# Patient Record
Sex: Male | Born: 1945
Health system: Southern US, Community
[De-identification: ages and names within clinical notes are randomized; demographics above are authoritative.]

## PROBLEM LIST (undated history)

## (undated) DIAGNOSIS — C801 Malignant (primary) neoplasm, unspecified: Secondary | ICD-10-CM

## (undated) DIAGNOSIS — E785 Hyperlipidemia, unspecified: Secondary | ICD-10-CM

## (undated) HISTORY — PX: MELANOMA EXCISION: SHX5266

## (undated) HISTORY — PX: HERNIA REPAIR: SHX51

---

## 2004-02-27 ENCOUNTER — Ambulatory Visit: Payer: Self-pay | Admitting: Internal Medicine

## 2004-05-16 ENCOUNTER — Ambulatory Visit: Payer: Self-pay | Admitting: Radiation Oncology

## 2004-06-28 ENCOUNTER — Ambulatory Visit: Payer: Self-pay | Admitting: Internal Medicine

## 2004-07-05 ENCOUNTER — Ambulatory Visit: Payer: Self-pay | Admitting: Internal Medicine

## 2004-07-27 ENCOUNTER — Ambulatory Visit: Payer: Self-pay | Admitting: Internal Medicine

## 2004-09-14 ENCOUNTER — Ambulatory Visit: Payer: Self-pay | Admitting: Radiation Oncology

## 2004-09-16 ENCOUNTER — Ambulatory Visit: Payer: Self-pay | Admitting: Radiation Oncology

## 2004-09-30 ENCOUNTER — Ambulatory Visit: Payer: Self-pay | Admitting: General Surgery

## 2004-10-05 ENCOUNTER — Ambulatory Visit: Payer: Self-pay | Admitting: Internal Medicine

## 2004-10-27 ENCOUNTER — Ambulatory Visit: Payer: Self-pay | Admitting: Internal Medicine

## 2004-11-26 ENCOUNTER — Ambulatory Visit: Payer: Self-pay | Admitting: Internal Medicine

## 2004-12-12 ENCOUNTER — Ambulatory Visit: Payer: Self-pay | Admitting: Unknown Physician Specialty

## 2004-12-27 ENCOUNTER — Ambulatory Visit: Payer: Self-pay | Admitting: Internal Medicine

## 2005-02-08 ENCOUNTER — Ambulatory Visit: Payer: Self-pay | Admitting: Internal Medicine

## 2005-02-26 ENCOUNTER — Ambulatory Visit: Payer: Self-pay | Admitting: Internal Medicine

## 2005-03-29 ENCOUNTER — Ambulatory Visit: Payer: Self-pay | Admitting: Internal Medicine

## 2005-05-30 ENCOUNTER — Ambulatory Visit: Payer: Self-pay | Admitting: Internal Medicine

## 2005-06-29 ENCOUNTER — Ambulatory Visit: Payer: Self-pay | Admitting: Internal Medicine

## 2005-10-25 ENCOUNTER — Ambulatory Visit: Payer: Self-pay | Admitting: Internal Medicine

## 2005-10-27 ENCOUNTER — Ambulatory Visit: Payer: Self-pay | Admitting: Internal Medicine

## 2006-01-17 ENCOUNTER — Ambulatory Visit: Payer: Self-pay | Admitting: Internal Medicine

## 2006-01-27 ENCOUNTER — Ambulatory Visit: Payer: Self-pay | Admitting: Internal Medicine

## 2006-02-26 ENCOUNTER — Ambulatory Visit: Payer: Self-pay | Admitting: Internal Medicine

## 2006-04-27 ENCOUNTER — Ambulatory Visit: Payer: Self-pay | Admitting: Internal Medicine

## 2006-07-16 ENCOUNTER — Ambulatory Visit: Payer: Self-pay | Admitting: Internal Medicine

## 2006-07-28 ENCOUNTER — Ambulatory Visit: Payer: Self-pay | Admitting: Internal Medicine

## 2006-08-28 ENCOUNTER — Ambulatory Visit: Payer: Self-pay | Admitting: Internal Medicine

## 2007-02-27 ENCOUNTER — Ambulatory Visit: Payer: Self-pay | Admitting: Internal Medicine

## 2007-03-13 ENCOUNTER — Ambulatory Visit: Payer: Self-pay | Admitting: Internal Medicine

## 2007-03-30 ENCOUNTER — Ambulatory Visit: Payer: Self-pay | Admitting: Internal Medicine

## 2007-05-02 ENCOUNTER — Ambulatory Visit: Payer: Self-pay | Admitting: Radiation Oncology

## 2007-05-02 ENCOUNTER — Ambulatory Visit: Payer: Self-pay | Admitting: Internal Medicine

## 2007-05-30 ENCOUNTER — Ambulatory Visit: Payer: Self-pay | Admitting: Radiation Oncology

## 2008-02-27 ENCOUNTER — Ambulatory Visit: Payer: Self-pay | Admitting: Internal Medicine

## 2008-03-05 ENCOUNTER — Ambulatory Visit: Payer: Self-pay | Admitting: Internal Medicine

## 2008-03-18 ENCOUNTER — Ambulatory Visit: Payer: Self-pay | Admitting: Internal Medicine

## 2008-03-29 ENCOUNTER — Ambulatory Visit: Payer: Self-pay | Admitting: Internal Medicine

## 2008-04-28 ENCOUNTER — Ambulatory Visit: Payer: Self-pay | Admitting: Radiation Oncology

## 2008-04-30 ENCOUNTER — Ambulatory Visit: Payer: Self-pay | Admitting: Internal Medicine

## 2008-05-29 ENCOUNTER — Ambulatory Visit: Payer: Self-pay | Admitting: Radiation Oncology

## 2009-01-13 ENCOUNTER — Ambulatory Visit: Payer: Self-pay | Admitting: Family Medicine

## 2009-03-29 ENCOUNTER — Ambulatory Visit: Payer: Self-pay | Admitting: Internal Medicine

## 2009-04-28 ENCOUNTER — Ambulatory Visit: Payer: Self-pay | Admitting: Internal Medicine

## 2011-04-26 ENCOUNTER — Ambulatory Visit: Payer: Self-pay | Admitting: Surgery

## 2011-04-26 DIAGNOSIS — I499 Cardiac arrhythmia, unspecified: Secondary | ICD-10-CM

## 2011-05-03 ENCOUNTER — Ambulatory Visit: Payer: Self-pay | Admitting: Surgery

## 2012-12-06 LAB — HM HEPATITIS C SCREENING LAB: HM Hepatitis Screen: NEGATIVE

## 2014-05-08 DIAGNOSIS — I739 Peripheral vascular disease, unspecified: Secondary | ICD-10-CM | POA: Insufficient documentation

## 2014-07-29 LAB — TSH: TSH: 2.07 u[IU]/mL (ref 0.41–5.90)

## 2014-07-29 LAB — LIPID PANEL
Cholesterol: 244 mg/dL — AB (ref 0–200)
HDL: 80 mg/dL — AB (ref 35–70)
LDL Cholesterol: 143 mg/dL
LDl/HDL Ratio: 3.1
Triglycerides: 106 mg/dL (ref 40–160)

## 2014-07-29 LAB — CBC AND DIFFERENTIAL
HCT: 44 % (ref 41–53)
Hemoglobin: 14.6 g/dL (ref 13.5–17.5)
Neutrophils Absolute: 2 /uL
Platelets: 220 10*3/uL (ref 150–399)
WBC: 3.3 10*3/mL

## 2014-07-29 LAB — BASIC METABOLIC PANEL
BUN: 23 mg/dL — AB (ref 4–21)
CREATININE: 1 mg/dL (ref 0.6–1.3)
GLUCOSE: 103 mg/dL
POTASSIUM: 4.6 mmol/L (ref 3.4–5.3)
SODIUM: 141 mmol/L (ref 137–147)

## 2014-07-29 LAB — HEPATIC FUNCTION PANEL
ALK PHOS: 50 U/L (ref 25–125)
ALT: 21 U/L (ref 10–40)
AST: 24 U/L (ref 14–40)
BILIRUBIN, TOTAL: 0.4 mg/dL

## 2014-07-29 LAB — PSA: PSA: 2.1

## 2015-04-03 ENCOUNTER — Other Ambulatory Visit: Payer: Self-pay | Admitting: Family Medicine

## 2015-05-03 ENCOUNTER — Encounter
Admission: RE | Disposition: A | Discharge status: Home / Self Care | Payer: Self-pay | Source: Ambulatory Visit | Attending: Unknown Physician Specialty

## 2015-05-03 ENCOUNTER — Ambulatory Visit: Payer: BLUE CROSS/BLUE SHIELD | Admitting: Anesthesiology

## 2015-05-03 ENCOUNTER — Encounter: Payer: Self-pay | Admitting: *Deleted

## 2015-05-03 ENCOUNTER — Ambulatory Visit
Admission: RE | Admit: 2015-05-03 | Discharge: 2015-05-03 | Disposition: A | Discharge status: Home / Self Care | Payer: BLUE CROSS/BLUE SHIELD | Source: Ambulatory Visit | Attending: Unknown Physician Specialty | Admitting: Unknown Physician Specialty

## 2015-05-03 DIAGNOSIS — Z7982 Long term (current) use of aspirin: Secondary | ICD-10-CM | POA: Diagnosis not present

## 2015-05-03 DIAGNOSIS — K640 First degree hemorrhoids: Secondary | ICD-10-CM | POA: Insufficient documentation

## 2015-05-03 DIAGNOSIS — K5730 Diverticulosis of large intestine without perforation or abscess without bleeding: Secondary | ICD-10-CM | POA: Diagnosis not present

## 2015-05-03 DIAGNOSIS — Z86010 Personal history of colonic polyps: Secondary | ICD-10-CM | POA: Diagnosis present

## 2015-05-03 DIAGNOSIS — Z800 Family history of malignant neoplasm of digestive organs: Secondary | ICD-10-CM | POA: Diagnosis present

## 2015-05-03 DIAGNOSIS — Z79899 Other long term (current) drug therapy: Secondary | ICD-10-CM | POA: Diagnosis not present

## 2015-05-03 DIAGNOSIS — Z1211 Encounter for screening for malignant neoplasm of colon: Secondary | ICD-10-CM | POA: Diagnosis not present

## 2015-05-03 DIAGNOSIS — D122 Benign neoplasm of ascending colon: Secondary | ICD-10-CM | POA: Insufficient documentation

## 2015-05-03 HISTORY — PX: COLONOSCOPY: SHX5424

## 2015-05-03 HISTORY — DX: Malignant (primary) neoplasm, unspecified: C80.1

## 2015-05-03 LAB — HM COLONOSCOPY

## 2015-05-03 SURGERY — COLONOSCOPY
Anesthesia: General

## 2015-05-03 MED ORDER — PROPOFOL 500 MG/50ML IV EMUL
INTRAVENOUS | Status: DC | PRN
  Administered 2015-05-03: 100 ug/kg/min via INTRAVENOUS

## 2015-05-03 MED ORDER — EPHEDRINE SULFATE 50 MG/ML IJ SOLN
INTRAMUSCULAR | Status: DC | PRN
  Administered 2015-05-03 (×2): 5 mg via INTRAVENOUS
  Administered 2015-05-03: 10 mg via INTRAVENOUS

## 2015-05-03 MED ORDER — MIDAZOLAM HCL 5 MG/5ML IJ SOLN
INTRAMUSCULAR | Status: DC | PRN
  Administered 2015-05-03: 1 mg via INTRAVENOUS

## 2015-05-03 MED ORDER — FENTANYL CITRATE (PF) 100 MCG/2ML IJ SOLN
INTRAMUSCULAR | Status: DC | PRN
  Administered 2015-05-03: 50 ug via INTRAVENOUS

## 2015-05-03 MED ORDER — LIDOCAINE HCL (PF) 2 % IJ SOLN
INTRAMUSCULAR | Status: DC | PRN
  Administered 2015-05-03: 50 mg

## 2015-05-03 MED ORDER — PROPOFOL 10 MG/ML IV BOLUS
INTRAVENOUS | Status: DC | PRN
  Administered 2015-05-03: 30 mg via INTRAVENOUS
  Administered 2015-05-03: 20 mg via INTRAVENOUS

## 2015-05-03 MED ORDER — SODIUM CHLORIDE 0.9 % IV SOLN
INTRAVENOUS | Status: DC
  Administered 2015-05-03: 14:00:00 via INTRAVENOUS

## 2015-05-03 MED ORDER — SODIUM CHLORIDE 0.9 % IV SOLN: INTRAVENOUS | Status: DC

## 2015-05-03 NOTE — H&P (Signed)
   Primary Care Physician:  Wilhemena Durie, MD Primary Gastroenterologist:  Dr. Vira Agar  Pre-Procedure History & Physical: HPI:  Marc Munoz is a 69 y.o. male is here for an colonoscopy.   Past Medical History  Diagnosis Date  . Cancer [facility])     Past Surgical History  Procedure Laterality Date  . Hernia repair      Prior to Admission medications   Medication Sig Start Date End Date Taking? Authorizing Provider  aspirin EC 81 MG tablet Take 81 mg by mouth daily.   Yes Historical Provider, MD  Multiple Vitamins-Minerals (IMMUNE SYSTEM BOOSTER PO) Take 2,800 mg by mouth every other day.   Yes Historical Provider, MD  simvastatin (ZOCOR) 40 MG tablet Take 40 mg by mouth daily.   Yes Historical Provider, MD  Vitamin D, Ergocalciferol, (DRISDOL) 50000 UNITS CAPS capsule take 1 capsule by mouth every week 04/05/15  Yes Richard Maceo Pro., MD    Allergies as of 03/19/2015  . (Not on File)    History reviewed. No pertinent family history.  Social History   Social History  . Marital Status: Married    Spouse Name: N/A  . Number of Children: N/A  . Years of Education: N/A   Occupational History  . Not on file.   Social History Main Topics  . Smoking status: Never Smoker   . Smokeless tobacco: Never Used  . Alcohol Use: 0.6 oz/week    1 Cans of beer per week  . Drug Use: No  . Sexual Activity: Not on file   Other Topics Concern  . Not on file   Social History Narrative  . No narrative on file    Review of Systems: See HPI, otherwise negative ROS  Physical Exam: BP 143/82 mmHg  Pulse 64  Temp(Src) 96.8 F (36 C) (Tympanic)  Resp 18  Ht 6\' 1"  (1.854 m)  Wt 75.297 kg (166 lb)  BMI 21.91 kg/m2  SpO2 100% General:   Alert,  pleasant and cooperative in NAD Head:  Normocephalic and atraumatic. Neck:  Supple; no masses or thyromegaly. Lungs:  Clear throughout to auscultation.    Heart:  Regular rate and rhythm. Abdomen:  Soft, nontender and nondistended.  Normal bowel sounds, without guarding, and without rebound.   Neurologic:  Alert and  oriented x4;  grossly normal neurologically.  Impression/Plan: Marc Munoz is here for an colonoscopy to be performed for [facility] colon polyps and FH CC in father.  Risks, benefits, limitations, and alternatives regarding  colonoscopy have been reviewed with the patient.  Questions have been answered.  All parties agreeable.   Gaylyn Cheers, MD  05/03/2015, 2:00 PM

## 2015-05-03 NOTE — Transfer of Care (Signed)
Immediate Anesthesia Transfer of Care Note  Patient: Marc Munoz  Procedure(s) Performed: Procedure(s): COLONOSCOPY (N/A)  Patient Location: PACU  Anesthesia Type:General  Level of Consciousness: sedated  Airway & Oxygen Therapy: Patient Spontanous Breathing and Patient connected to nasal cannula oxygen  Post-op Assessment: Report given to RN and Post -op Vital signs reviewed and stable  Post vital signs: Reviewed and stable  Last Vitals:  Filed Vitals:   05/03/15 1317  BP: 143/82  Pulse: 64  Temp: 36 C  Resp: 18    Complications: No apparent anesthesia complications

## 2015-05-03 NOTE — Op Note (Signed)
[facility] Gastroenterology Patient Name: Marc Munoz Procedure Date: 05/03/2015 1:57 PM MRN: [id number] Account #: [id number] Date of Birth: 06-Jan-1946 Admit Type: Outpatient Age: 69 Room: [facility] ENDO ROOM 1 Gender: Male Note Status: Finalized Procedure:         Colonoscopy Indications:       High risk colon cancer surveillance: Personal history of                     colonic polyps, Family history of colon cancer in a                     first-degree relative Providers:         Manya Silvas, MD Referring MD:      Janine Ores. Rosanna Randy, MD (Referring MD) Medicines:         Propofol per Anesthesia Complications:     No immediate complications. Procedure:         Pre-Anesthesia Assessment:                    - After reviewing the risks and benefits, the patient was                     deemed in satisfactory condition to undergo the procedure.                    After obtaining informed consent, the colonoscope was                     passed under direct vision. Throughout the procedure, the                     patient's blood pressure, pulse, and oxygen saturations                     were monitored continuously. The Colonoscope was                     introduced through the anus and advanced to the the cecum,                     identified by appendiceal orifice and ileocecal valve. The                     colonoscopy was performed without difficulty. The patient                     tolerated the procedure well. The quality of the bowel                     preparation was good. Findings:      A medium polyp was found in the ascending colon. The polyp was sessile.       The polyp was removed with a hot snare. Resection and retrieval were       complete.      Multiple small- medium-mouthed diverticula were found in the sigmoid       colon and in the descending colon.      Internal hemorrhoids were found during endoscopy. The hemorrhoids were       small and  Grade I (internal hemorrhoids that do not prolapse).      The exam was otherwise without abnormality. Impression:        - One medium polyp in the ascending colon.  Resected and                     retrieved.                    - Diverticulosis in the sigmoid colon and in the                     descending colon.                    - Internal hemorrhoids.                    - The examination was otherwise normal. Recommendation:    - Await pathology results. Manya Silvas, MD 05/03/2015 2:38:29 PM This report has been signed electronically. Number of Addenda: 0 Note Initiated On: 05/03/2015 1:57 PM Scope Withdrawal Time: 0 hours 19 minutes 37 seconds  Total Procedure Duration: 0 hours 25 minutes 57 seconds       [facility]

## 2015-05-03 NOTE — Anesthesia Preprocedure Evaluation (Signed)
Anesthesia Evaluation  Patient identified by MRN, date of birth, ID band Patient awake    Reviewed: Allergy & Precautions, H&P , NPO status , Patient's Chart, lab work & pertinent test results, reviewed documented beta blocker date and time   Airway Mallampati: II  TM Distance: >3 FB Neck ROM: full    Dental no notable dental hx. (+) Caps, Missing   Pulmonary neg pulmonary ROS,    Pulmonary exam normal breath sounds clear to auscultation       Cardiovascular Exercise Tolerance: Good negative cardio ROS Normal cardiovascular exam Rhythm:regular Rate:Normal     Neuro/Psych negative neurological ROS  negative psych ROS   GI/Hepatic negative GI ROS, Neg liver ROS,   Endo/Other  negative endocrine ROS  Renal/GU negative Renal ROS  negative genitourinary   Musculoskeletal   Abdominal   Peds  Hematology negative hematology ROS (+)   Anesthesia Other Findings Past Medical History:   Cancer ([place])                                                 Reproductive/Obstetrics negative OB ROS                             Anesthesia Physical Anesthesia Plan  ASA: II  Anesthesia Plan: General   Post-op Pain Management:    Induction:   Airway Management Planned:   Additional Equipment:   Intra-op Plan:   Post-operative Plan:   Informed Consent: I have reviewed the patients History and Physical, chart, labs and discussed the procedure including the risks, benefits and alternatives for the proposed anesthesia with the patient or authorized representative who has indicated his/her understanding and acceptance.   Dental Advisory Given  Plan Discussed with: Anesthesiologist, CRNA and Surgeon  Anesthesia Plan Comments:         Anesthesia Quick Evaluation

## 2015-05-05 LAB — SURGICAL PATHOLOGY

## 2015-05-05 NOTE — Anesthesia Postprocedure Evaluation (Signed)
Anesthesia Post Note  Patient: Marc Munoz  Procedure(s) Performed: Procedure(s) (LRB): COLONOSCOPY (N/A)  Patient location during evaluation: Endoscopy Anesthesia Type: General Level of consciousness: awake and alert Pain management: pain level controlled Vital Signs Assessment: post-procedure vital signs reviewed and stable Respiratory status: spontaneous breathing, nonlabored ventilation, respiratory function stable and patient connected to nasal cannula oxygen Cardiovascular status: blood pressure returned to baseline and stable Postop Assessment: no signs of nausea or vomiting Anesthetic complications: no    Last Vitals:  Filed Vitals:   05/03/15 1500 05/03/15 1510  BP: 124/65 143/68  Pulse: 64 63  Temp:    Resp: 16 15    Last Pain:  Filed Vitals:   05/04/15 0804  PainSc: 0-No pain                 Martha Clan

## 2015-06-03 DIAGNOSIS — K5790 Diverticulosis of intestine, part unspecified, without perforation or abscess without bleeding: Secondary | ICD-10-CM | POA: Insufficient documentation

## 2015-06-03 DIAGNOSIS — E785 Hyperlipidemia, unspecified: Secondary | ICD-10-CM | POA: Insufficient documentation

## 2015-06-03 DIAGNOSIS — K4090 Unilateral inguinal hernia, without obstruction or gangrene, not specified as recurrent: Secondary | ICD-10-CM | POA: Insufficient documentation

## 2015-06-03 DIAGNOSIS — M26659 Arthropathy of unspecified temporomandibular joint: Secondary | ICD-10-CM | POA: Insufficient documentation

## 2015-06-03 DIAGNOSIS — E559 Vitamin D deficiency, unspecified: Secondary | ICD-10-CM | POA: Insufficient documentation

## 2015-06-03 DIAGNOSIS — C4A9 Merkel cell carcinoma, unspecified: Secondary | ICD-10-CM | POA: Insufficient documentation

## 2015-06-03 DIAGNOSIS — Z85828 Personal history of other malignant neoplasm of skin: Secondary | ICD-10-CM | POA: Insufficient documentation

## 2015-06-03 DIAGNOSIS — M26609 Unspecified temporomandibular joint disorder, unspecified side: Secondary | ICD-10-CM

## 2015-06-29 ENCOUNTER — Other Ambulatory Visit: Payer: Self-pay | Admitting: Family Medicine

## 2015-07-14 ENCOUNTER — Encounter: Payer: Self-pay | Admitting: Family Medicine

## 2015-07-14 ENCOUNTER — Ambulatory Visit (INDEPENDENT_AMBULATORY_CARE_PROVIDER_SITE_OTHER): Payer: BLUE CROSS/BLUE SHIELD | Admitting: Family Medicine

## 2015-07-14 VITALS — BP 108/62 | HR 58 | Temp 97.6°F | Resp 16 | Ht 71.0 in | Wt 161.0 lb

## 2015-07-14 DIAGNOSIS — Z125 Encounter for screening for malignant neoplasm of prostate: Secondary | ICD-10-CM | POA: Diagnosis not present

## 2015-07-14 DIAGNOSIS — Z0000 Encounter for general adult medical examination without abnormal findings: Secondary | ICD-10-CM | POA: Diagnosis not present

## 2015-07-14 DIAGNOSIS — N529 Male erectile dysfunction, unspecified: Secondary | ICD-10-CM

## 2015-07-14 LAB — POCT URINALYSIS DIPSTICK
Bilirubin, UA: NEGATIVE
Blood, UA: NEGATIVE
Glucose, UA: NEGATIVE
Ketones, UA: NEGATIVE
LEUKOCYTES UA: NEGATIVE
Nitrite, UA: NEGATIVE
PROTEIN UA: NEGATIVE
Spec Grav, UA: 1.02
UROBILINOGEN UA: 0.2
pH, UA: 7.5

## 2015-07-14 MED ORDER — SILDENAFIL CITRATE 20 MG PO TABS: 20.0000 mg | ORAL_TABLET | Freq: Three times a day (TID) | ORAL | Status: DC

## 2015-07-14 NOTE — Progress Notes (Addendum)
Patient ID: Marc Munoz, male   DOB: Jul 29, 1945, 70 y.o.   MRN: [id number] Visit Date: 07/14/2015  Today's Provider: Wilhemena Durie, MD   Chief Complaint  Patient presents with  . Medicare Wellness   Subjective:   Marc Munoz is a 70 y.o. male who presents today for his Subsequent Annual Wellness Visit. He feels well. He reports he is exercising. He reports he is sleeping well. Colonoscopy-- 04/13/10 diverticulosis, internal hemorrhoids, tubular adenoma repeat 03/2015  Immunization History  Administered Date(s) Administered  . Pneumococcal Conjugate-13 07/08/2014  . Pneumococcal Polysaccharide-23 06/11/2012  . Tdap 06/11/2012  . Zoster 06/11/2012     Review of Systems  Constitutional: Negative.   HENT: Negative.   Eyes: Negative.   Respiratory: Negative.   Cardiovascular: Negative.   Gastrointestinal: Negative.   Endocrine: Negative.   Genitourinary: Negative.   Musculoskeletal: Negative.   Skin: Negative.   Allergic/Immunologic: Negative.   Neurological: Negative.   Hematological: Negative.   Psychiatric/Behavioral: Negative.     Patient Active Problem List   Diagnosis Date Noted  . DD (diverticular disease) 06/03/2015  . H/O malignant neoplasm of skin 06/03/2015  . HLD (hyperlipidemia) 06/03/2015  . Hernia, inguinal, left 06/03/2015  . 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 Arthropathy of temporomandibular joint 06/03/2015  . Avitaminosis D 06/03/2015  . Peripheral vascular disease ([place]) 05/08/2014    Social History   Social History  . Marital Status: Married    Spouse Name: N/A  . Number of Children: N/A  . Years of Education: N/A   Occupational History  . Not on file.   Social History Main Topics  . Smoking status: Never Smoker   . Smokeless tobacco: Never Used  . Alcohol Use: 0.6 oz/week    1 Cans of beer per week  . Drug Use: No  . Sexual Activity: Not on file   Other Topics Concern  . Not on file   Social History Narrative     Past Surgical History  Procedure Laterality Date  . Hernia repair    . Colonoscopy N/A 05/03/2015    Procedure: COLONOSCOPY;  Surgeon: Manya Silvas, MD;  Location[facility] ENDOSCOPY;  Service: Endoscopy;  Laterality: N/A;  . Melanoma excision      His family history includes Arthritis in his father; COPD in his father; Colon cancer in his father; Heart disease in his mother; Hypertension in his father and mother; Stroke in his brother.    Outpatient Prescriptions Prior to Visit  Medication Sig Dispense Refill  . aspirin EC 81 MG tablet Take 81 mg by mouth daily.    . meloxicam (MOBIC) 15 MG tablet Take by mouth.    . Multiple Vitamins tablet Take by mouth.    . Multiple Vitamins-Minerals (IMMUNE SYSTEM BOOSTER PO) Take 2,800 mg by mouth every other day.    . Nutritional Supplements (IMMUNE ENHANCE) TABS Take by mouth.    . simvastatin (ZOCOR) 40 MG tablet take 1 tablet by mouth once daily 30 tablet 0  . Vitamin D, Ergocalciferol, (DRISDOL) 50000 UNITS CAPS capsule take 1 capsule by mouth every week 4 capsule 12  . aspirin 81 MG tablet Take by mouth.     No facility-administered medications prior to visit.    No Known Allergies  Patient Care Team: Jerrol Banana., MD as PCP - General (Family Medicine)  Objective:   Vitals:  Filed Vitals:   07/14/15 0829  BP: 108/62  Pulse: 58  Temp: 97.6 F (36.4  C)  TempSrc: Oral  Resp: 16  Height: 5\' 11"  (1.803 m)  Weight: 161 lb (73.029 kg)    Physical Exam  Constitutional: He is oriented to person, place, and time. He appears well-developed and well-nourished.  HENT:  Head: Normocephalic and atraumatic.  Right Ear: External ear normal.  Left Ear: External ear normal.  Nose: Nose normal.  Mouth/Throat: Oropharynx is clear and moist.  Eyes: Conjunctivae and EOM are normal. Pupils are equal, round, and reactive to light.  Neck: Normal range of motion. Neck supple.  Cardiovascular: Normal rate, regular rhythm, normal  heart sounds and intact distal pulses.   Pulmonary/Chest: Effort normal and breath sounds normal.  Abdominal: Soft. Bowel sounds are normal.  Genitourinary: Rectum normal, prostate normal and penis normal.  Musculoskeletal: Normal range of motion.  Neurological: He is alert and oriented to person, place, and time. He has normal reflexes.  Skin: Skin is warm and dry.  Very fair skinned patient. He is followed closely by Dr. Evorn Gong.  Psychiatric: He has a normal mood and affect. His behavior is normal. Judgment and thought content normal.    Activities of Daily Living In your present state of health, do you have any difficulty performing the following activities: 07/14/2015  Hearing? N  Vision? N  Difficulty concentrating or making decisions? N  Walking or climbing stairs? N  Dressing or bathing? N  Doing errands, shopping? N    Fall Risk Assessment Fall Risk  07/14/2015  Falls in the past year? No     Depression Screen PHQ 2/9 Scores 07/14/2015  PHQ - 2 Score 0    Cognitive Testing - 6-CIT    Year: 0 points  Month: 0 points  Memorize "[place], [place], [address], [place]"  Time (within 1 hour:) 0 points  Count backwards from 20: 0 points  Name months of year: 0 points  Repeat Address: 4points   Total Score: 4/28  Interpretation : Normal (0-7) Abnormal (8-28)    Assessment & Plan:     Annual Wellness Visit  Reviewed patient's Family Medical History Reviewed and updated list of patient's medical providers Assessment of cognitive impairment was done Assessed patient's functional ability Established a written schedule for health screening [place] Completed and Reviewed  Exercise Activities and Dietary recommendations Goals    None      Immunization History  Administered Date(s) Administered  . Pneumococcal Conjugate-13 07/08/2014  . Pneumococcal Polysaccharide-23 06/11/2012  . Tdap 06/11/2012  . Zoster 06/11/2012    Health  Maintenance  Topic Date Due  . Hepatitis C Screening  03-Feb-1946  . INFLUENZA VACCINE  05/12/2016 (Originally 12/28/2014)  . TETANUS/TDAP  06/11/2022  . COLONOSCOPY  05/02/2025  . ZOSTAVAX  Completed  . PNA vac Low Risk Adult  Completed   Check labs today  colonoscopy was done November 2016 Discussed health benefits of physical activity, and encouraged him to engage in regular exercise appropriate for his age and condition.  Merkel cell skin cancer Followed by dermatology.-Dr Dasher Hyperlipidemia Diverticulosis ED Try sildenafil 20 mg, 2-4 tablets daily when necessary #50, 12 refills. I have done the exam and reviewed the above chart and it is accurate to the best of my knowledge.  Miguel Aschoff MD [facility] 07/14/2015 8:33 AM  ------------------------------------------------------------------------------------------------------------    .

## 2015-07-29 LAB — LIPID PANEL
Cholesterol: 240 mg/dL — AB (ref 0–200)
HDL: 91 mg/dL — AB (ref 35–70)
LDL CALC: 131 mg/dL
LDl/HDL Ratio: 2.6
Triglycerides: 91 mg/dL (ref 40–160)

## 2015-07-29 LAB — BASIC METABOLIC PANEL
BUN: 23 mg/dL — AB (ref 4–21)
Creatinine: 1.2 mg/dL (ref 0.6–1.3)
GLUCOSE: 109 mg/dL
Potassium: 5.2 mmol/L (ref 3.4–5.3)
SODIUM: 143 mmol/L (ref 137–147)

## 2015-07-29 LAB — PSA: PSA: 2.5

## 2015-07-29 LAB — HEPATIC FUNCTION PANEL
ALT: 22 U/L (ref 10–40)
AST: 21 U/L (ref 14–40)
Alkaline Phosphatase: 52 U/L (ref 25–125)
Bilirubin, Total: 0.4 mg/dL

## 2015-07-29 LAB — CBC AND DIFFERENTIAL
HCT: 44 % (ref 41–53)
Hemoglobin: 14.8 g/dL (ref 13.5–17.5)
NEUTROS ABS: 2 /uL
PLATELETS: 222 10*3/uL (ref 150–399)
WBC: 3.6 10^3/mL

## 2015-07-29 LAB — TSH: TSH: 1.77 u[IU]/mL (ref 0.41–5.90)

## 2015-07-31 ENCOUNTER — Other Ambulatory Visit: Payer: Self-pay | Admitting: Family Medicine

## 2015-08-18 ENCOUNTER — Encounter: Payer: Self-pay | Admitting: Family Medicine

## 2015-09-15 DIAGNOSIS — D0339 Melanoma in situ of other parts of face: Secondary | ICD-10-CM | POA: Diagnosis not present

## 2015-09-15 DIAGNOSIS — D485 Neoplasm of uncertain behavior of skin: Secondary | ICD-10-CM | POA: Diagnosis not present

## 2015-09-15 DIAGNOSIS — L570 Actinic keratosis: Secondary | ICD-10-CM | POA: Diagnosis not present

## 2015-09-15 DIAGNOSIS — Z85820 Personal history of malignant melanoma of skin: Secondary | ICD-10-CM | POA: Diagnosis not present

## 2015-11-11 DIAGNOSIS — L814 Other melanin hyperpigmentation: Secondary | ICD-10-CM | POA: Diagnosis not present

## 2015-11-11 DIAGNOSIS — L908 Other atrophic disorders of skin: Secondary | ICD-10-CM | POA: Diagnosis not present

## 2015-11-11 DIAGNOSIS — D0339 Melanoma in situ of other parts of face: Secondary | ICD-10-CM | POA: Diagnosis not present

## 2015-11-11 DIAGNOSIS — L578 Other skin changes due to chronic exposure to nonionizing radiation: Secondary | ICD-10-CM | POA: Diagnosis not present

## 2015-12-16 DIAGNOSIS — Z85820 Personal history of malignant melanoma of skin: Secondary | ICD-10-CM | POA: Diagnosis not present

## 2015-12-16 DIAGNOSIS — L570 Actinic keratosis: Secondary | ICD-10-CM | POA: Diagnosis not present

## 2016-01-11 ENCOUNTER — Ambulatory Visit (INDEPENDENT_AMBULATORY_CARE_PROVIDER_SITE_OTHER): Payer: BLUE CROSS/BLUE SHIELD | Admitting: Family Medicine

## 2016-01-11 ENCOUNTER — Encounter: Payer: Self-pay | Admitting: Family Medicine

## 2016-01-11 VITALS — BP 126/70 | HR 56 | Temp 97.7°F | Resp 16 | Wt 160.0 lb

## 2016-01-11 DIAGNOSIS — E785 Hyperlipidemia, unspecified: Secondary | ICD-10-CM | POA: Diagnosis not present

## 2016-01-11 DIAGNOSIS — Z85828 Personal history of other malignant neoplasm of skin: Secondary | ICD-10-CM

## 2016-01-11 DIAGNOSIS — D0330 Melanoma in situ of unspecified part of face: Secondary | ICD-10-CM | POA: Diagnosis not present

## 2016-01-11 NOTE — Progress Notes (Addendum)
Patient: Marc Munoz Male    DOB: February 03, 1946   70 y.o.   MRN: [id number] Visit Date: 01/11/2016  Today's Provider: Wilhemena Durie, MD   Chief Complaint  Patient presents with  . Hyperlipidemia   Subjective:    HPI      Lipid/Cholesterol, Follow-up:   Last seen for this6 months ago.  Management changes since that visit include none. . Last Lipid Panel:    Component Value Date/Time   CHOL 240 (A) 07/29/2015   TRIG 91 07/29/2015   HDL 91 (A) 07/29/2015   LDLCALC 131 07/29/2015     He reports excellent compliance with treatment. He is not having side effects.  Weight trend: stable Current diet: in general, a "healthy" diet   Current exercise: teaches tennis, as well as plays the sport  Wt Readings from Last 3 Encounters:  01/11/16 160 lb (72.6 kg)  07/14/15 161 lb (73 kg)  07/08/14 163 lb (73.9 kg)    -------------------------------------------------------------------   No Known Allergies Current Meds  Medication Sig  . aspirin EC 81 MG tablet Take 81 mg by mouth daily.  . Multiple Vitamins tablet Take by mouth.  . Nutritional Supplements (IMMUNE ENHANCE) TABS Take by mouth.  . sildenafil (REVATIO) 20 MG tablet Take 1 tablet (20 mg total) by mouth 3 (three) times daily.  . simvastatin (ZOCOR) 40 MG tablet take 1 tablet by mouth once daily  . Vitamin D, Ergocalciferol, (DRISDOL) 50000 UNITS CAPS capsule take 1 capsule by mouth every week    Review of Systems  Constitutional: Negative for activity change, appetite change, chills, diaphoresis, fatigue, fever and unexpected weight change.  Respiratory: Negative for cough and shortness of breath.   Cardiovascular: Negative for chest pain, palpitations and leg swelling.    Social History  Substance Use Topics  . Smoking status: Never Smoker  . Smokeless tobacco: Never Used  . Alcohol use 0.6 oz/week    1 Cans of beer per week   Objective:   BP 126/70 (BP Location: Left Arm, Patient Position:  Sitting, Cuff Size: Normal)   Pulse (!) 56   Temp 97.7 F (36.5 C) (Oral)   Resp 16   Wt 160 lb (72.6 kg)   BMI 22.32 kg/m   Physical Exam  Constitutional: He appears well-developed and well-nourished.  HENT:  Head: Normocephalic and atraumatic.  Right Ear: External ear normal.  Left Ear: External ear normal.  Nose: Nose normal.  Eyes: Conjunctivae and EOM are normal. Pupils are equal, round, and reactive to light. Right eye exhibits no discharge. Left eye exhibits no discharge.  Neck: Normal range of motion. Neck supple. No tracheal deviation present. No thyromegaly present.  Cardiovascular: Normal rate, regular rhythm and normal heart sounds.   No carotid bruit  Pulmonary/Chest: Effort normal and breath sounds normal. No respiratory distress.  Abdominal: Soft.  Musculoskeletal: Normal range of motion. He exhibits no edema.  Lymphadenopathy:    He has no cervical adenopathy.  Skin: Skin is warm and dry.  Scar above left eyebrow from recent skin cancer removal  Psychiatric: He has a normal mood and affect. His behavior is normal. Judgment and thought content normal.        Assessment & Plan:      1. HLD (hyperlipidemia) Stable. Continue current medications and plan of care.  2. H/O malignant neoplasm of skin/recent melanoma in situ Stable. FU with dermatology (Dr. Evorn Gong) as scheduled/ needed. I did recommend patient undergo blue light  therapy with Dr. Evorn Gong as he has had too many to count skin cancers through the years. Return to clinic 6 months for wellness. Patient seen and examined by Miguel Aschoff, MD, and note scribed by Renaldo Fiddler, CMA.  I have done the exam and reviewed the above chart and it is accurate to the best of my knowledge.  Aydenn Gervin Cranford Mon, MD  [facility]

## 2016-01-13 DIAGNOSIS — L820 Inflamed seborrheic keratosis: Secondary | ICD-10-CM | POA: Diagnosis not present

## 2016-03-07 DIAGNOSIS — H1131 Conjunctival hemorrhage, right eye: Secondary | ICD-10-CM | POA: Diagnosis not present

## 2016-03-11 ENCOUNTER — Ambulatory Visit (INDEPENDENT_AMBULATORY_CARE_PROVIDER_SITE_OTHER): Payer: BLUE CROSS/BLUE SHIELD

## 2016-03-11 DIAGNOSIS — Z23 Encounter for immunization: Secondary | ICD-10-CM

## 2016-04-03 DIAGNOSIS — D0461 Carcinoma in situ of skin of right upper limb, including shoulder: Secondary | ICD-10-CM | POA: Diagnosis not present

## 2016-04-03 DIAGNOSIS — Z85820 Personal history of malignant melanoma of skin: Secondary | ICD-10-CM | POA: Diagnosis not present

## 2016-04-03 DIAGNOSIS — L570 Actinic keratosis: Secondary | ICD-10-CM | POA: Diagnosis not present

## 2016-04-03 DIAGNOSIS — D485 Neoplasm of uncertain behavior of skin: Secondary | ICD-10-CM | POA: Diagnosis not present

## 2016-05-06 ENCOUNTER — Other Ambulatory Visit: Payer: Self-pay | Admitting: Family Medicine

## 2016-05-08 DIAGNOSIS — L570 Actinic keratosis: Secondary | ICD-10-CM | POA: Diagnosis not present

## 2016-05-17 DIAGNOSIS — R001 Bradycardia, unspecified: Secondary | ICD-10-CM | POA: Diagnosis not present

## 2016-05-17 DIAGNOSIS — E782 Mixed hyperlipidemia: Secondary | ICD-10-CM | POA: Diagnosis not present

## 2016-05-17 DIAGNOSIS — I739 Peripheral vascular disease, unspecified: Secondary | ICD-10-CM | POA: Diagnosis not present

## 2016-05-17 DIAGNOSIS — I10 Essential (primary) hypertension: Secondary | ICD-10-CM | POA: Diagnosis not present

## 2016-06-06 DIAGNOSIS — D485 Neoplasm of uncertain behavior of skin: Secondary | ICD-10-CM | POA: Diagnosis not present

## 2016-06-06 DIAGNOSIS — C4442 Squamous cell carcinoma of skin of scalp and neck: Secondary | ICD-10-CM | POA: Diagnosis not present

## 2016-06-06 DIAGNOSIS — C44622 Squamous cell carcinoma of skin of right upper limb, including shoulder: Secondary | ICD-10-CM | POA: Diagnosis not present

## 2016-06-06 DIAGNOSIS — L905 Scar conditions and fibrosis of skin: Secondary | ICD-10-CM | POA: Diagnosis not present

## 2016-06-16 DIAGNOSIS — I340 Nonrheumatic mitral (valve) insufficiency: Secondary | ICD-10-CM | POA: Diagnosis not present

## 2016-06-16 DIAGNOSIS — R001 Bradycardia, unspecified: Secondary | ICD-10-CM | POA: Diagnosis not present

## 2016-06-16 DIAGNOSIS — E782 Mixed hyperlipidemia: Secondary | ICD-10-CM | POA: Diagnosis not present

## 2016-06-16 DIAGNOSIS — I10 Essential (primary) hypertension: Secondary | ICD-10-CM | POA: Diagnosis not present

## 2016-06-16 DIAGNOSIS — I739 Peripheral vascular disease, unspecified: Secondary | ICD-10-CM | POA: Diagnosis not present

## 2016-07-19 ENCOUNTER — Ambulatory Visit (INDEPENDENT_AMBULATORY_CARE_PROVIDER_SITE_OTHER): Payer: BLUE CROSS/BLUE SHIELD | Admitting: Family Medicine

## 2016-07-19 ENCOUNTER — Encounter: Payer: Self-pay | Admitting: Family Medicine

## 2016-07-19 VITALS — BP 124/70 | HR 60 | Temp 97.6°F | Resp 16 | Ht 71.0 in | Wt 161.0 lb

## 2016-07-19 DIAGNOSIS — Z0000 Encounter for general adult medical examination without abnormal findings: Secondary | ICD-10-CM

## 2016-07-19 DIAGNOSIS — Z1211 Encounter for screening for malignant neoplasm of colon: Secondary | ICD-10-CM

## 2016-07-19 LAB — IFOBT (OCCULT BLOOD): IMMUNOLOGICAL FECAL OCCULT BLOOD TEST: NEGATIVE

## 2016-07-19 LAB — POCT URINALYSIS DIPSTICK
Bilirubin, UA: NEGATIVE
GLUCOSE UA: NEGATIVE
Ketones, UA: NEGATIVE
Leukocytes, UA: NEGATIVE
NITRITE UA: NEGATIVE
PH UA: 6
Protein, UA: NEGATIVE
RBC UA: NEGATIVE
Spec Grav, UA: 1.02
UROBILINOGEN UA: 0.2

## 2016-07-19 NOTE — Progress Notes (Signed)
Patient: Marc Munoz, Male    DOB: [birth year], 71 y.o.   MRN: [id number] Visit Date: 07/19/2016  Today's Provider: Wilhemena Durie, MD   Chief Complaint  Patient presents with  . Annual Exam   Subjective:    Annual physical exam Marc Munoz is a 71 y.o. male who presents today for health maintenance and complete physical. He feels well. He reports exercising daily. Teaches tennis and stays active. He reports he is sleeping well.   ----------------------------------------------------------------- Last colonoscopy- 05/03/2015- Diverticulosis. 1 polyp. Internal hemorrhoids.  6CIT Screen 07/19/2016  What Year? 0 points  What month? 0 points  What time? 0 points  Count back from 20 0 points  Months in reverse 0 points  Repeat phrase 2 points  Total Score 2   Functional Status Survey: Is the patient deaf or have difficulty hearing?: Yes Does the patient have difficulty seeing, even when wearing glasses/contacts?: No Does the patient have difficulty concentrating, remembering, or making decisions?: No Does the patient have difficulty walking or climbing stairs?: No Does the patient have difficulty dressing or bathing?: No Does the patient have difficulty doing errands alone such as visiting a doctor's office or shopping?: No  Review of Systems  Constitutional: Negative.   HENT: Negative.   Eyes: Negative.   Respiratory: Negative.   Cardiovascular: Negative.   Gastrointestinal: Negative.   Endocrine: Negative.   Genitourinary: Negative.        Nocturia times 2.  Musculoskeletal: Negative.   Skin: Negative.   Allergic/Immunologic: Negative.   Neurological: Negative.   Hematological: Negative.   Psychiatric/Behavioral: Negative.     Social History      He  reports that he has never smoked. He has never used smokeless tobacco. He reports that he drinks about 0.6 oz of alcohol per week . He reports that he does not use drugs.       Social History   Social  History  . Marital status: Married    Spouse name: Manuela Schwartz  . Number of children: 1  . Years of education: 36   Occupational History  .  [place] History Main Topics  . Smoking status: Never Smoker  . Smokeless tobacco: Never Used  . Alcohol use 0.6 oz/week    1 Cans of beer per week     Comment: occasional  . Drug use: No  . Sexual activity: Yes   Other Topics Concern  . Not on file   Social History Narrative  . No narrative on file    Past Medical History:  Diagnosis Date  . Cancer [facility])      Patient Active Problem List   Diagnosis Date Noted  . ED (erectile dysfunction) 07/14/2015  . DD (diverticular disease) 06/03/2015  . H/O malignant neoplasm of skin 06/03/2015  . HLD (hyperlipidemia) 06/03/2015  . Hernia, inguinal, left 06/03/2015  . Merkel cell cancer ([place]) 06/03/2015  . Arthropathy of temporomandibular joint 06/03/2015  . Avitaminosis D 06/03/2015  . Peripheral vascular disease ([place]) 05/08/2014    Past Surgical History:  Procedure Laterality Date  . COLONOSCOPY N/A 05/03/2015   Procedure: COLONOSCOPY;  Surgeon: Manya Silvas, MD;  Location: [facility] ENDOSCOPY;  Service: Endoscopy;  Laterality: N/A;  . HERNIA REPAIR    . MELANOMA EXCISION      Family History        Family Status  Relation Status  . Brother Deceased  . Mother Deceased  . Father  Deceased        His family history includes Arthritis in his father; COPD in his father; Colon cancer in his father; Heart disease in his mother; Hypertension in his father and mother; Stroke in his brother.     No Known Allergies   Current Outpatient Prescriptions:  .  aspirin EC 81 MG tablet, Take 81 mg by mouth daily., Disp: , Rfl:  .  Multiple Vitamins tablet, Take by mouth., Disp: , Rfl:  .  Nutritional Supplements (IMMUNE ENHANCE) TABS, Take by mouth., Disp: , Rfl:  .  sildenafil (REVATIO) 20 MG tablet, Take 1 tablet (20 mg total) by mouth 3 (three) times daily., Disp: 50 tablet,  Rfl: 12 .  simvastatin (ZOCOR) 40 MG tablet, take 1 tablet by mouth once daily, Disp: 30 tablet, Rfl: 12 .  Vitamin D, Ergocalciferol, (DRISDOL) 50000 units CAPS capsule, take 1 capsule by mouth every week, Disp: 4 capsule, Rfl: 12   Patient Care Team: Jerrol Banana., MD as PCP - General (Family Medicine)      Objective:   Vitals: BP 124/70 (BP Location: Right Arm, Patient Position: Sitting, Cuff Size: Normal)   Pulse 60   Temp 97.6 F (36.4 C) (Oral)   Resp 16   Ht 5\' 11"  (1.803 m)   Wt 161 lb (73 kg)   BMI 22.45 kg/m    Physical Exam   Depression Screen PHQ 2/9 Scores 07/19/2016 07/14/2015  PHQ - 2 Score 0 0      Assessment & Plan:     Routine Health Maintenance and Physical Exam  Exercise Activities and Dietary recommendations Goals    None      Immunization History  Administered Date(s) Administered  . Influenza, High Dose Seasonal PF 03/11/2016  . Pneumococcal Conjugate-13 07/08/2014  . Pneumococcal Polysaccharide-23 06/11/2012  . Tdap 06/11/2012  . Zoster 06/11/2012    Health Maintenance  Topic Date Due  . Hepatitis C Screening  [birth year]  . TETANUS/TDAP  06/11/2022  . COLONOSCOPY  05/02/2025  . INFLUENZA VACCINE  Completed  . PNA vac Low Risk Adult  Completed     Discussed health benefits of physical activity, and encouraged him to engage in regular exercise appropriate for his age and condition.    --------------------------------------------------------------------  Patient seen and examined by Miguel Aschoff, MD, and note scribed by Renaldo Fiddler, CMA. I have done the exam and reviewed the above chart and it is accurate to the best of my knowledge. Development worker, community has been used in this note in any air is in the dictation or transcription are unintentional.  Wilhemena Durie, MD  [place]

## 2016-07-28 DIAGNOSIS — C4442 Squamous cell carcinoma of skin of scalp and neck: Secondary | ICD-10-CM | POA: Diagnosis not present

## 2016-07-28 DIAGNOSIS — L905 Scar conditions and fibrosis of skin: Secondary | ICD-10-CM | POA: Diagnosis not present

## 2016-08-16 LAB — LIPID PANEL
CHOLESTEROL: 250 mg/dL — AB (ref 0–200)
HDL: 87 mg/dL — AB (ref 35–70)
LDL CALC: 147 mg/dL
LDl/HDL Ratio: 2.9
TRIGLYCERIDES: 82 mg/dL (ref 40–160)

## 2016-08-16 LAB — CBC AND DIFFERENTIAL
HCT: 46 % (ref 41–53)
Hemoglobin: 15.6 g/dL (ref 13.5–17.5)
NEUTROS ABS: 2 /uL
PLATELETS: 241 10*3/uL (ref 150–399)
WBC: 3.9 10*3/mL

## 2016-08-16 LAB — BASIC METABOLIC PANEL
BUN: 23 mg/dL — AB (ref 4–21)
CREATININE: 1.1 mg/dL (ref 0.6–1.3)
Glucose: 99 mg/dL
Potassium: 4.8 mmol/L (ref 3.4–5.3)
Sodium: 139 mmol/L (ref 137–147)

## 2016-08-16 LAB — HEPATIC FUNCTION PANEL
ALK PHOS: 52 U/L (ref 25–125)
ALT: 20 U/L (ref 10–40)
AST: 23 U/L (ref 14–40)
BILIRUBIN, TOTAL: 0.3 mg/dL

## 2016-08-16 LAB — PSA: PSA: 3.1

## 2016-08-16 LAB — TSH: TSH: 2.65 u[IU]/mL (ref 0.41–5.90)

## 2016-08-19 ENCOUNTER — Other Ambulatory Visit: Payer: Self-pay | Admitting: Family Medicine

## 2016-08-22 ENCOUNTER — Encounter: Payer: Self-pay | Admitting: Family Medicine

## 2016-08-23 ENCOUNTER — Encounter: Payer: Self-pay | Admitting: Family Medicine

## 2016-09-25 DIAGNOSIS — L570 Actinic keratosis: Secondary | ICD-10-CM | POA: Diagnosis not present

## 2016-09-25 DIAGNOSIS — B079 Viral wart, unspecified: Secondary | ICD-10-CM | POA: Diagnosis not present

## 2016-09-25 DIAGNOSIS — C4339 Malignant melanoma of other parts of face: Secondary | ICD-10-CM | POA: Diagnosis not present

## 2016-09-25 DIAGNOSIS — D485 Neoplasm of uncertain behavior of skin: Secondary | ICD-10-CM | POA: Diagnosis not present

## 2016-10-04 DIAGNOSIS — G479 Sleep disorder, unspecified: Secondary | ICD-10-CM | POA: Diagnosis not present

## 2016-10-04 DIAGNOSIS — L905 Scar conditions and fibrosis of skin: Secondary | ICD-10-CM | POA: Diagnosis not present

## 2016-10-04 DIAGNOSIS — Z85821 Personal history of Merkel cell carcinoma: Secondary | ICD-10-CM | POA: Diagnosis not present

## 2016-10-04 DIAGNOSIS — R233 Spontaneous ecchymoses: Secondary | ICD-10-CM | POA: Diagnosis not present

## 2016-10-04 DIAGNOSIS — D0321 Melanoma in situ of right ear and external auricular canal: Secondary | ICD-10-CM | POA: Diagnosis not present

## 2016-10-04 DIAGNOSIS — Z800 Family history of malignant neoplasm of digestive organs: Secondary | ICD-10-CM | POA: Diagnosis not present

## 2016-10-04 DIAGNOSIS — C4330 Malignant melanoma of unspecified part of face: Secondary | ICD-10-CM | POA: Diagnosis not present

## 2016-10-04 DIAGNOSIS — Z85820 Personal history of malignant melanoma of skin: Secondary | ICD-10-CM | POA: Diagnosis not present

## 2016-10-04 DIAGNOSIS — Z8603 Personal history of neoplasm of uncertain behavior: Secondary | ICD-10-CM | POA: Diagnosis not present

## 2016-10-04 DIAGNOSIS — Z9221 Personal history of antineoplastic chemotherapy: Secondary | ICD-10-CM | POA: Diagnosis not present

## 2016-10-04 DIAGNOSIS — C4339 Malignant melanoma of other parts of face: Secondary | ICD-10-CM | POA: Diagnosis not present

## 2016-10-04 DIAGNOSIS — E7800 Pure hypercholesterolemia, unspecified: Secondary | ICD-10-CM | POA: Diagnosis not present

## 2016-10-04 DIAGNOSIS — Z923 Personal history of irradiation: Secondary | ICD-10-CM | POA: Diagnosis not present

## 2016-10-05 DIAGNOSIS — C4339 Malignant melanoma of other parts of face: Secondary | ICD-10-CM | POA: Diagnosis not present

## 2016-10-17 DIAGNOSIS — C4321 Malignant melanoma of right ear and external auricular canal: Secondary | ICD-10-CM | POA: Diagnosis not present

## 2016-10-17 DIAGNOSIS — C4339 Malignant melanoma of other parts of face: Secondary | ICD-10-CM | POA: Diagnosis not present

## 2016-10-17 DIAGNOSIS — L821 Other seborrheic keratosis: Secondary | ICD-10-CM | POA: Diagnosis not present

## 2016-10-17 DIAGNOSIS — Z9221 Personal history of antineoplastic chemotherapy: Secondary | ICD-10-CM | POA: Diagnosis not present

## 2016-10-17 DIAGNOSIS — E7800 Pure hypercholesterolemia, unspecified: Secondary | ICD-10-CM | POA: Diagnosis not present

## 2016-10-17 DIAGNOSIS — C4330 Malignant melanoma of unspecified part of face: Secondary | ICD-10-CM | POA: Diagnosis not present

## 2016-10-25 DIAGNOSIS — L821 Other seborrheic keratosis: Secondary | ICD-10-CM | POA: Diagnosis not present

## 2016-10-25 DIAGNOSIS — C4330 Malignant melanoma of unspecified part of face: Secondary | ICD-10-CM | POA: Diagnosis not present

## 2016-10-30 DIAGNOSIS — D485 Neoplasm of uncertain behavior of skin: Secondary | ICD-10-CM | POA: Diagnosis not present

## 2016-10-30 DIAGNOSIS — L570 Actinic keratosis: Secondary | ICD-10-CM | POA: Diagnosis not present

## 2016-10-30 DIAGNOSIS — D0439 Carcinoma in situ of skin of other parts of face: Secondary | ICD-10-CM | POA: Diagnosis not present

## 2016-10-30 DIAGNOSIS — D0461 Carcinoma in situ of skin of right upper limb, including shoulder: Secondary | ICD-10-CM | POA: Diagnosis not present

## 2016-10-30 DIAGNOSIS — Z85820 Personal history of malignant melanoma of skin: Secondary | ICD-10-CM | POA: Diagnosis not present

## 2016-11-01 DIAGNOSIS — Z4802 Encounter for removal of sutures: Secondary | ICD-10-CM | POA: Diagnosis not present

## 2016-11-01 DIAGNOSIS — C4A30 Merkel cell carcinoma of unspecified part of face: Secondary | ICD-10-CM | POA: Diagnosis not present

## 2016-11-01 DIAGNOSIS — C4330 Malignant melanoma of unspecified part of face: Secondary | ICD-10-CM | POA: Diagnosis not present

## 2016-11-13 DIAGNOSIS — L905 Scar conditions and fibrosis of skin: Secondary | ICD-10-CM | POA: Diagnosis not present

## 2016-11-13 DIAGNOSIS — D0439 Carcinoma in situ of skin of other parts of face: Secondary | ICD-10-CM | POA: Diagnosis not present

## 2016-11-20 DIAGNOSIS — D0461 Carcinoma in situ of skin of right upper limb, including shoulder: Secondary | ICD-10-CM | POA: Diagnosis not present

## 2016-12-07 DIAGNOSIS — C4330 Malignant melanoma of unspecified part of face: Secondary | ICD-10-CM | POA: Diagnosis not present

## 2016-12-26 DIAGNOSIS — Z6822 Body mass index (BMI) 22.0-22.9, adult: Secondary | ICD-10-CM | POA: Diagnosis not present

## 2016-12-26 DIAGNOSIS — C4339 Malignant melanoma of other parts of face: Secondary | ICD-10-CM | POA: Diagnosis not present

## 2017-01-29 ENCOUNTER — Emergency Department: Payer: BLUE CROSS/BLUE SHIELD

## 2017-01-29 ENCOUNTER — Observation Stay
Admission: EM | Admit: 2017-01-29 | Discharge: 2017-01-30 | Disposition: A | Discharge status: Home / Self Care | Payer: BLUE CROSS/BLUE SHIELD | Attending: Internal Medicine | Admitting: Internal Medicine

## 2017-01-29 ENCOUNTER — Encounter: Payer: Self-pay | Admitting: *Deleted

## 2017-01-29 DIAGNOSIS — R778 Other specified abnormalities of plasma proteins: Secondary | ICD-10-CM

## 2017-01-29 DIAGNOSIS — R55 Syncope and collapse: Secondary | ICD-10-CM

## 2017-01-29 DIAGNOSIS — N529 Male erectile dysfunction, unspecified: Secondary | ICD-10-CM | POA: Diagnosis not present

## 2017-01-29 DIAGNOSIS — Y9369 Activity, other involving other sports and athletics played as a team or group: Secondary | ICD-10-CM | POA: Diagnosis not present

## 2017-01-29 DIAGNOSIS — Y998 Other external cause status: Secondary | ICD-10-CM | POA: Insufficient documentation

## 2017-01-29 DIAGNOSIS — E785 Hyperlipidemia, unspecified: Secondary | ICD-10-CM | POA: Insufficient documentation

## 2017-01-29 DIAGNOSIS — E860 Dehydration: Secondary | ICD-10-CM | POA: Diagnosis not present

## 2017-01-29 DIAGNOSIS — N179 Acute kidney failure, unspecified: Principal | ICD-10-CM | POA: Insufficient documentation

## 2017-01-29 DIAGNOSIS — Y92312 Tennis court as the place of occurrence of the external cause: Secondary | ICD-10-CM | POA: Diagnosis not present

## 2017-01-29 DIAGNOSIS — M6282 Rhabdomyolysis: Secondary | ICD-10-CM | POA: Insufficient documentation

## 2017-01-29 DIAGNOSIS — Z8249 Family history of ischemic heart disease and other diseases of the circulatory system: Secondary | ICD-10-CM | POA: Insufficient documentation

## 2017-01-29 DIAGNOSIS — E559 Vitamin D deficiency, unspecified: Secondary | ICD-10-CM | POA: Insufficient documentation

## 2017-01-29 DIAGNOSIS — T672XXA Heat cramp, initial encounter: Secondary | ICD-10-CM | POA: Insufficient documentation

## 2017-01-29 DIAGNOSIS — I739 Peripheral vascular disease, unspecified: Secondary | ICD-10-CM | POA: Diagnosis not present

## 2017-01-29 DIAGNOSIS — R7989 Other specified abnormal findings of blood chemistry: Secondary | ICD-10-CM | POA: Insufficient documentation

## 2017-01-29 DIAGNOSIS — Z85820 Personal history of malignant melanoma of skin: Secondary | ICD-10-CM | POA: Diagnosis not present

## 2017-01-29 DIAGNOSIS — X32XXXA Exposure to sunlight, initial encounter: Secondary | ICD-10-CM | POA: Diagnosis not present

## 2017-01-29 DIAGNOSIS — Z79899 Other long term (current) drug therapy: Secondary | ICD-10-CM | POA: Diagnosis not present

## 2017-01-29 DIAGNOSIS — Z7982 Long term (current) use of aspirin: Secondary | ICD-10-CM | POA: Diagnosis not present

## 2017-01-29 DIAGNOSIS — Z8719 Personal history of other diseases of the digestive system: Secondary | ICD-10-CM | POA: Diagnosis not present

## 2017-01-29 DIAGNOSIS — E875 Hyperkalemia: Secondary | ICD-10-CM | POA: Insufficient documentation

## 2017-01-29 DIAGNOSIS — R252 Cramp and spasm: Secondary | ICD-10-CM | POA: Diagnosis not present

## 2017-01-29 HISTORY — DX: Hyperlipidemia, unspecified: E78.5

## 2017-01-29 LAB — TROPONIN I
TROPONIN I: 0.04 ng/mL — AB (ref <0.03)
Troponin I: 0.04 ng/mL (ref <0.03)

## 2017-01-29 LAB — BASIC METABOLIC PANEL
Anion gap: 9 (ref 5–15)
BUN: 20 mg/dL (ref 6–20)
CO2: 27 mmol/L (ref 22–32)
CREATININE: 1.79 mg/dL — AB (ref 0.61–1.24)
Calcium: 10 mg/dL (ref 8.9–10.3)
Chloride: 98 mmol/L — ABNORMAL LOW (ref 101–111)
GFR calc Af Amer: 43 mL/min — ABNORMAL LOW (ref >60)
GFR, EST NON AFRICAN AMERICAN: 37 mL/min — AB (ref >60)
GLUCOSE: 90 mg/dL (ref 65–99)
POTASSIUM: 5.8 mmol/L — AB (ref 3.5–5.1)
SODIUM: 134 mmol/L — AB (ref 135–145)

## 2017-01-29 LAB — CBC
HCT: 42.3 % (ref 40.0–52.0)
Hemoglobin: 14.6 g/dL (ref 13.0–18.0)
MCH: 31.6 pg (ref 26.0–34.0)
MCHC: 34.6 g/dL (ref 32.0–36.0)
MCV: 91.2 fL (ref 80.0–100.0)
PLATELETS: 206 10*3/uL (ref 150–440)
RBC: 4.63 MIL/uL (ref 4.40–5.90)
RDW: 13.5 % (ref 11.5–14.5)
WBC: 9.8 10*3/uL (ref 3.8–10.6)

## 2017-01-29 LAB — PROTIME-INR
INR: 0.97
Prothrombin Time: 12.8 seconds (ref 11.4–15.2)

## 2017-01-29 LAB — POTASSIUM: Potassium: 4.3 mmol/L (ref 3.5–5.1)

## 2017-01-29 LAB — CK: Total CK: 585 U/L — ABNORMAL HIGH (ref 49–397)

## 2017-01-29 MED ORDER — ADULT MULTIVITAMIN W/MINERALS CH
1.0000 | ORAL_TABLET | Freq: Every day | ORAL | Status: DC
  Filled 2017-01-29: qty 1

## 2017-01-29 MED ORDER — ACETAMINOPHEN 325 MG PO TABS: 650.0000 mg | ORAL_TABLET | Freq: Four times a day (QID) | ORAL | Status: DC | PRN

## 2017-01-29 MED ORDER — SODIUM CHLORIDE 0.9 % IV BOLUS (SEPSIS)
1000.0000 mL | Freq: Once | INTRAVENOUS | Status: AC
  Administered 2017-01-29: 1000 mL via INTRAVENOUS

## 2017-01-29 MED ORDER — ASPIRIN EC 81 MG PO TBEC
81.0000 mg | DELAYED_RELEASE_TABLET | Freq: Every day | ORAL | Status: DC
  Administered 2017-01-30: 81 mg via ORAL
  Filled 2017-01-29: qty 1

## 2017-01-29 MED ORDER — SODIUM CHLORIDE 0.9 % IV SOLN
INTRAVENOUS | Status: DC
  Administered 2017-01-29 – 2017-01-30 (×2): via INTRAVENOUS

## 2017-01-29 MED ORDER — BISACODYL 5 MG PO TBEC: 5.0000 mg | DELAYED_RELEASE_TABLET | Freq: Every day | ORAL | Status: DC | PRN

## 2017-01-29 MED ORDER — ONDANSETRON HCL 4 MG/2ML IJ SOLN: 4.0000 mg | Freq: Four times a day (QID) | INTRAMUSCULAR | Status: DC | PRN

## 2017-01-29 MED ORDER — DOCUSATE SODIUM 100 MG PO CAPS
100.0000 mg | ORAL_CAPSULE | Freq: Two times a day (BID) | ORAL | Status: DC
  Filled 2017-01-29: qty 1

## 2017-01-29 MED ORDER — ACETAMINOPHEN 650 MG RE SUPP: 650.0000 mg | Freq: Four times a day (QID) | RECTAL | Status: DC | PRN

## 2017-01-29 MED ORDER — ENOXAPARIN SODIUM 40 MG/0.4ML ~~LOC~~ SOLN: 40.0000 mg | SUBCUTANEOUS | Status: DC

## 2017-01-29 MED ORDER — ONDANSETRON HCL 4 MG PO TABS: 4.0000 mg | ORAL_TABLET | Freq: Four times a day (QID) | ORAL | Status: DC | PRN

## 2017-01-29 NOTE — ED Notes (Signed)
Date and time results received: 01/29/17 1806   Test: tropinin Critical Value: 0.04  Name of Provider Notified: Dr. Jacqualine Code

## 2017-01-29 NOTE — Progress Notes (Signed)
Anticoagulation monitoring(Lovenox):  71 yo male ordered Lovenox 30 mg Q24h  Filed Weights   01/29/17 1702 01/29/17 2101  Weight: 165 lb (74.8 kg) 159 lb 8 oz (72.3 kg)   BMI    Lab Results  Component Value Date   CREATININE 1.79 (H) 01/29/2017   CREATININE 1.1 08/16/2016   CREATININE 1.2 07/29/2015   Estimated Creatinine Clearance: 39.3 mL/min (A) (by C-G formula based on SCr of 1.79 mg/dL (H)). Hemoglobin & Hematocrit     Component Value Date/Time   HGB 14.6 01/29/2017 1727   HCT 42.3 01/29/2017 1727     Per Protocol for Patient with estCrcl > 30 ml/min and BMI < 40, will transition to Lovenox 40 mg Q24h.

## 2017-01-29 NOTE — ED Triage Notes (Signed)
Per EMS report, patient participated in a seniors tennis tournament for the last three days and had a syncopal episode when he got home today. Patient states his set started at 76 which was usually too late in the day to start. Patient reports cramping in bilateral legs/arms/hands. Patient states he drank water and ate bananas without relief. Patient has a shallow abrasion on left side of neck and left shin.

## 2017-01-29 NOTE — ED Provider Notes (Signed)
[facility] Emergency Department Provider Note   ____________________________________________   First MD Initiated Contact with Patient 01/29/17 1710     (approximate)  I have reviewed the triage vital signs and the nursing notes.   HISTORY  Chief Complaint Loss of Consciousness    HPI Marc Munoz is a 71 y.o. male con home from a plane 3 hours of tennis today. While walking up the stairs his son heard a thud and found him passed out on the stairs. Patient reports he awoke and has been having severe cramps in the arms and legs since determine which is not too unusual after playing for several days in the hot sun. He drinks large amounts of [place] water-drinking tournaments to stay hydrated but reports he "can't keep up" with this losses.  Denies any chest pain or trouble breathing. No numbness or weakness, reports all of his muscles were cramping on him earlier. She had similar episodes in the past but has never passed out.  presently reports he started to feel better after EMS initiated IV fluids. I was having moderate to severe cramps throughout his arms and legs, he does take a statin and baby aspirin daily  Past Medical History:  Diagnosis Date  . Cancer ([place])    melanoma  . Hyperlipemia     Patient Active Problem List   Diagnosis Date Noted  . ED (erectile dysfunction) 07/14/2015  . DD (diverticular disease) 06/03/2015  . H/O malignant neoplasm of skin 06/03/2015  . HLD (hyperlipidemia) 06/03/2015  . Hernia, inguinal, left 06/03/2015  . Merkel cell cancer ([place]) 06/03/2015  . Arthropathy of temporomandibular joint 06/03/2015  . Avitaminosis D 06/03/2015  . Peripheral vascular disease ([place]) 05/08/2014    Past Surgical History:  Procedure Laterality Date  . COLONOSCOPY N/A 05/03/2015   Procedure: COLONOSCOPY;  Surgeon: Manya Silvas, MD;  Location: [facility] ENDOSCOPY;  Service: Endoscopy;  Laterality: N/A;  . HERNIA REPAIR    . MELANOMA  EXCISION      Prior to Admission medications   Medication Sig Start Date End Date Taking? Authorizing Provider  aspirin EC 81 MG tablet Take 81 mg by mouth daily.   Yes [provider]  Multiple Vitamins tablet Take 1 tablet by mouth daily.  04/10/11  Yes [provider]  Nutritional Supplements (IMMUNE ENHANCE) TABS Take 1 tablet by mouth daily.  04/10/11  Yes [provider]  simvastatin (ZOCOR) 40 MG tablet take 1 tablet by mouth once daily 08/21/16  Yes Jerrol Banana., MD  sildenafil (REVATIO) 20 MG tablet Take 1 tablet (20 mg total) by mouth 3 (three) times daily. Patient not taking: Reported on 01/29/2017 07/14/15   Jerrol Banana., MD  Vitamin D, Ergocalciferol, (DRISDOL) 50000 units CAPS capsule take 1 capsule by mouth every week Patient not taking: Reported on 01/29/2017 05/07/16   Jerrol Banana., MD    Allergies Patient has no known allergies.  Family History  Problem Relation Age of Onset  . Stroke Brother   . Heart disease Mother   . Hypertension Mother   . Hypertension Father   . Arthritis Father   . COPD Father   . Colon cancer Father     Social History Social History  Substance Use Topics  . Smoking status: Never Smoker  . Smokeless tobacco: Never Used  . Alcohol use 0.6 oz/week    1 Cans of beer per week     Comment: occasional  Review of Systems Constitutional: No fever/chills Eyes: No visual changes. ENT: No sore throat.normal cephalic atraumatic Cardiovascular: Denies chest pain. Respiratory: Denies shortness of breath. Gastrointestinal: No abdominal pain.  No nausea, no vomiting.  No diarrhea.  No constipation. Genitourinary: Negative for dysuria. Musculoskeletal: Negative for back pain. Skin: Negative for rash. Neurological: Negative for headaches, focal weakness or numbness.    ____________________________________________   PHYSICAL EXAM:  VITAL SIGNS: ED Triage Vitals  Enc Vitals Group      BP 01/29/17 1659 118/73     Pulse Rate 01/29/17 1659 66     Resp 01/29/17 1659 18     Temp 01/29/17 1659 98.1 F (36.7 C)     Temp Source 01/29/17 1659 Oral     SpO2 01/29/17 1659 100 %     Weight 01/29/17 1702 165 lb (74.8 kg)     Height 01/29/17 1702 6' (1.829 m)     Head Circumference --      Peak Flow --      Pain Score --      Pain Loc --      Pain Edu? --      Excl. in [place]? --     Constitutional: Alert and oriented. clearly ill-appearing, slightly diaphoreticEyes: Conjunctivae are normal. Head: Atraumatic. Nose: No congestion/rhinnorhea. Mouth/Throat: Mucous membranes are moist.no cervical spine tenderness Neck: No stridor.   Cardiovascular: Normal rate, regular rhythm. Grossly normal heart sounds.  Good peripheral circulation. Respiratory: Normal respiratory effort.  No retractions. Lungs CTAB. Gastrointestinal: Soft and nontender. No distention. Musculoskeletal: No lower extremity tenderness nor edema. Neurologic:  Normal speech and language. No gross focal neurologic deficits are appreciated.  Skin:  Skin is warm, dry and intact. No rash noted. Psychiatric: Mood and affect are normal. Speech and behavior are normal.  ____________________________________________   LABS (all labs ordered are listed, but only abnormal results are displayed)  Labs Reviewed  BASIC METABOLIC PANEL - Abnormal; Notable for the following:       Result Value   Sodium 134 (*)    Potassium 5.8 (*)    Chloride 98 (*)    Creatinine, Ser 1.79 (*)    GFR calc non Af Amer 37 (*)    GFR calc Af Amer 43 (*)    All other components within normal limits  TROPONIN I - Abnormal; Notable for the following:    Troponin I 0.04 (*)    All other components within normal limits  CK - Abnormal; Notable for the following:    Total CK 585 (*)    All other components within normal limits  CBC  PROTIME-INR   ____________________________________________  EKG  reviewed and interpreted by me, normal  sinus rhythm, possible ST elevation versus repolarization abnormality noted in V2, V3, and inferior abnormality including inversions also noted.  ____________________________________________  RADIOLOGY  No results found.  ____________________________________________   PROCEDURES  Procedure(s) performed: None  Procedures  Critical Care performed: No  ____________________________________________   INITIAL IMPRESSION / ASSESSMENT AND PLAN / ED COURSE  Pertinent labs & imaging results that were available during my care of the patient were reviewed by me and considered in my medical decision making (see chart for details).    Clinical Course as of Jan 30 1848  Mon Jan 29, 2017  1711 patient reports feeling much better now. He is not having any chest pain. Never had any chest pain or trouble breathing. Reports cramps from being out in the heat, and reports that similar before but  has never passed out. Denies any neurologic symptoms at this time. EKG abnormal, but appears similar to a prior, I have called on-call MD for STEMI to evaluate, but given his symptoms improvement and no chest pain.... It seems less likely STEMI and more likely heat related or dehydration   [MQ]  1715 EKG sent to Dr. Clayborn Bigness who is reviewing  [MQ]    Clinical Course User Index [MQ] Delman Kitten, MD    ----------------------------------------- 6:52 PM on 01/29/2017 -----------------------------------------  Patient denies any pain. He is feeling better. Discussed with the patient, reviewed labs and we will provide additional fluids. Appears likely moderately dehydrated, hyperkalemia also notedand I think willimprove with fluids. Will admit patient for dehydration, syncope obs.  ____________________________________________   FINAL CLINICAL IMPRESSION(S) / ED DIAGNOSES  Final diagnoses:  Hyperkalemia  Heat cramp, initial encounter  Elevated troponin  Dehydration, moderate  Syncope and collapse       NEW MEDICATIONS STARTED DURING THIS VISIT:  New Prescriptions   No medications on file     Note:  This document was prepared using Dragon voice recognition software and may include unintentional dictation errors.     Delman Kitten, MD 01/29/17 [phone]

## 2017-01-29 NOTE — ED Notes (Signed)
Family at bedside. Patient declined CT scan of the head. Dr. Jacqualine Code informed.

## 2017-01-29 NOTE — H&P (Signed)
[place] [facility] [place] NAME: Marc Munoz    MR#:  [id number]  DATE OF BIRTH:  Oct 12, 1945  DATE OF ADMISSION:  01/29/2017  PRIMARY CARE PHYSICIAN: Jerrol Banana., MD   REQUESTING/REFERRING PHYSICIAN: Dr. Delman Kitten  CHIEF COMPLAINT: Syncope   Chief Complaint  Patient presents with  . Loss of Consciousness    HISTORY OF PRESENT ILLNESS:  Marc Munoz  is a 71 y.o. male with a known history of Hyperlipidemia brought in by EMS because of syncope. Patient has been playing tennis in hot weather since Friday every day for 3 hours. Today he won his last match. And he went home and son heard him pass out on the steps. EMS brought him here. He received IV fluids by EMS and he started to feel better.  having severe leg cramps today.  PAST MEDICAL HISTORY:   Past Medical History:  Diagnosis Date  . Cancer ([place])    melanoma  . Hyperlipemia     PAST SURGICAL HISTOIRY:   Past Surgical History:  Procedure Laterality Date  . COLONOSCOPY N/A 05/03/2015   Procedure: COLONOSCOPY;  Surgeon: Manya Silvas, MD;  Location: [facility] ENDOSCOPY;  Service: Endoscopy;  Laterality: N/A;  . HERNIA REPAIR    . MELANOMA EXCISION      SOCIAL HISTORY:   Social History  Substance Use Topics  . Smoking status: Never Smoker  . Smokeless tobacco: Never Used  . Alcohol use 0.6 oz/week    1 Cans of beer per week     Comment: occasional    FAMILY HISTORY:   Family History  Problem Relation Age of Onset  . Stroke Brother   . Heart disease Mother   . Hypertension Mother   . Hypertension Father   . Arthritis Father   . COPD Father   . Colon cancer Father     DRUG ALLERGIES:  No Known Allergies  REVIEW OF SYSTEMS:  CONSTITUTIONAL: No fever, fatigue or weakness.  EYES: No blurred or double vision.  EARS, NOSE, AND THROAT: No tinnitus or ear pain.  RESPIRATORY: No cough, shortness of breath, wheezing or hemoptysis.  CARDIOVASCULAR: No chest  pain, orthopnea, edema.  GASTROINTESTINAL: No nausea, vomiting, diarrhea or abdominal pain.  GENITOURINARY: No dysuria, hematuria.  ENDOCRINE: No polyuria, nocturia,  HEMATOLOGY: No anemia, easy bruising or bleeding SKIN: No rash or lesion. MUSCULOSKELETAL: No joint pain or arthritis.   NEUROLOGIC: No tingling, numbness, weakness.  PSYCHIATRY: No anxiety or depression.   MEDICATIONS AT HOME:   Prior to Admission medications   Medication Sig Start Date End Date Taking? Authorizing Provider  aspirin EC 81 MG tablet Take 81 mg by mouth daily.   Yes [provider]  Multiple Vitamins tablet Take 1 tablet by mouth daily.  04/10/11  Yes [provider]  Nutritional Supplements (IMMUNE ENHANCE) TABS Take 1 tablet by mouth daily.  04/10/11  Yes [provider]  simvastatin (ZOCOR) 40 MG tablet take 1 tablet by mouth once daily 08/21/16  Yes Jerrol Banana., MD  sildenafil (REVATIO) 20 MG tablet Take 1 tablet (20 mg total) by mouth 3 (three) times daily. Patient not taking: Reported on 01/29/2017 07/14/15   Jerrol Banana., MD  Vitamin D, Ergocalciferol, (DRISDOL) 50000 units CAPS capsule take 1 capsule by mouth every week Patient not taking: Reported on 01/29/2017 05/07/16   Jerrol Banana., MD      VITAL SIGNS:  Blood pressure  124/71, pulse 72, temperature 98.1 F (36.7 C), temperature source Oral, resp. rate 14, height 6' (1.829 m), weight 74.8 kg (165 lb), SpO2 100 %.  PHYSICAL EXAMINATION:  GENERAL:  71 y.o.-year-old patient lying in the bed with no acute distress.  EYES: Pupils equal, round, reactive to light and accommodation. No scleral icterus. Extraocular muscles intact.  HEENT: Head atraumatic, normocephalic. Oropharynx and nasopharynx clear.  NECK:  Supple, no jugular venous distention. No thyroid enlargement, no tenderness.  LUNGS: Normal breath sounds bilaterally, no wheezing, rales,rhonchi or crepitation. No use of accessory muscles of  respiration.  CARDIOVASCULAR: S1, S2 normal. No murmurs, rubs, or gallops.  ABDOMEN: Soft, nontender, nondistended. Bowel sounds present. No organomegaly or mass.  EXTREMITIES: No pedal edema, cyanosis, or clubbing.  NEUROLOGIC: Cranial nerves II through XII are intact. Muscle strength 5/5 in all extremities. Sensation intact. Gait not checked.  PSYCHIATRIC: The patient is alert and oriented x 3.  SKIN: No obvious rash, lesion, or ulcer.   LABORATORY PANEL:   CBC  Recent Labs Lab 01/29/17 1727  WBC 9.8  HGB 14.6  HCT 42.3  PLT 206   ------------------------------------------------------------------------------------------------------------------  Chemistries   Recent Labs Lab 01/29/17 1727  NA 134*  K 5.8*  CL 98*  CO2 27  GLUCOSE 90  BUN 20  CREATININE 1.79*  CALCIUM 10.0   ------------------------------------------------------------------------------------------------------------------  Cardiac Enzymes  Recent Labs Lab 01/29/17 1727  TROPONINI 0.04*   ------------------------------------------------------------------------------------------------------------------  RADIOLOGY:  No results found.  EKG:   Orders placed or performed during the hospital encounter of 01/29/17  . EKG 12-Lead  . EKG 12-Lead   possible ST elevation versus repolarization abnormality noted in V2, V3, and inferior abnormality including inversions also noted. Dr. Donney Dice reviewed the EKG and did not feel patient has any acute EKG changes. IMPRESSION AND PLAN:   [age] year old male patient with syncope secondary to dehydration with acute kidney injury. And dehydration. Patient has been playing outside in hot and humid weather every day for 3-4 hours since Saturday. Admit to hospitalist service, started IV hydration function #1 syncope due to dehydration: Continue IV fluids,  #2 acute renal failure with ATN due to dehydration: Continue IV fluids, monitor kidney function. #3/ mild  hyperkalemia likely secondary to drinking a lot of coconut water: Check potassium again. #4 /heat cramps secondary to dehydration. Has mild rhabdomyolysis as well. Continue IV hydration. And hold statins.   All the records are reviewed and case discussed with ED provider. Management plans discussed with the patient, family and they are in agreement.  CODE STATUS: full  TOTAL TIME TAKING CARE OF THIS PATIENT: 62minutes.    Epifanio Lesches M.D on 01/29/2017 at 7:28 PM  Between 7am to 6pm - Pager - [phone]  After 6pm go to [email or web address] - password [email or web address] [place] [facility]  Office  [phone]  CC: Primary care physician; Jerrol Banana., MD  Note: This dictation was prepared with Dragon dictation along with smaller phrase technology. Any transcriptional errors that result from this process are unintentional.

## 2017-01-30 ENCOUNTER — Telehealth: Payer: Self-pay | Admitting: Family Medicine

## 2017-01-30 DIAGNOSIS — E785 Hyperlipidemia, unspecified: Secondary | ICD-10-CM | POA: Diagnosis not present

## 2017-01-30 DIAGNOSIS — R55 Syncope and collapse: Secondary | ICD-10-CM | POA: Diagnosis not present

## 2017-01-30 DIAGNOSIS — N179 Acute kidney failure, unspecified: Secondary | ICD-10-CM | POA: Diagnosis not present

## 2017-01-30 LAB — BASIC METABOLIC PANEL
ANION GAP: 5 (ref 5–15)
BUN: 17 mg/dL (ref 6–20)
CALCIUM: 9 mg/dL (ref 8.9–10.3)
CO2: 27 mmol/L (ref 22–32)
Chloride: 107 mmol/L (ref 101–111)
Creatinine, Ser: 1.31 mg/dL — ABNORMAL HIGH (ref 0.61–1.24)
GFR calc Af Amer: >60 mL/min (ref >60)
GFR calc non Af Amer: 54 mL/min — ABNORMAL LOW (ref >60)
GLUCOSE: 94 mg/dL (ref 65–99)
POTASSIUM: 4.4 mmol/L (ref 3.5–5.1)
Sodium: 139 mmol/L (ref 135–145)

## 2017-01-30 LAB — CBC
HEMATOCRIT: 41.7 % (ref 40.0–52.0)
HEMOGLOBIN: 14.3 g/dL (ref 13.0–18.0)
MCH: 32.1 pg (ref 26.0–34.0)
MCHC: 34.3 g/dL (ref 32.0–36.0)
MCV: 93.6 fL (ref 80.0–100.0)
Platelets: 164 10*3/uL (ref 150–440)
RBC: 4.46 MIL/uL (ref 4.40–5.90)
RDW: 13.9 % (ref 11.5–14.5)
WBC: 5.5 10*3/uL (ref 3.8–10.6)

## 2017-01-30 LAB — TROPONIN I: Troponin I: 0.04 ng/mL (ref <0.03)

## 2017-01-30 LAB — GLUCOSE, CAPILLARY: Glucose-Capillary: 79 mg/dL (ref 65–99)

## 2017-01-30 NOTE — Telephone Encounter (Signed)
See below-aa 

## 2017-01-30 NOTE — Progress Notes (Signed)
Discharge instructions explained to pt and pts wife/ verbalized an understanding/ iv and tele removed/ will transport off  unit via wheelchair.  

## 2017-01-30 NOTE — Telephone Encounter (Signed)
Pt called to schedule hospital F/U. Pt is scheduled for 11 am on 02/05/17. Pt stated he was discharged this morning 01/30/17 and was treated for dehydration. Thanks TNP

## 2017-01-30 NOTE — Discharge Summary (Signed)
[facility] NAME: Marc Munoz    MR#:  [id number]  DATE OF BIRTH:  June 10, 1945  DATE OF ADMISSION:  01/29/2017 ADMITTING PHYSICIAN: Epifanio Lesches, MD  DATE OF DISCHARGE: 01/30/2017  PRIMARY CARE PHYSICIAN: Jerrol Banana., MD    ADMISSION DIAGNOSIS:  Syncope and collapse [R55] Hyperkalemia [E87.5] Elevated troponin [R74.8] Dehydration, moderate [E86.0] Heat cramp, initial encounter [T67.2XXA]  DISCHARGE DIAGNOSIS:  Active Problems:   Acute renal failure (ARF) ([place])   SECONDARY DIAGNOSIS:   Past Medical History:  Diagnosis Date  . Cancer ([place])    melanoma  . 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  1. Syncope due to dehydration and acute kidney injury Patient had no arrhythmias on telemetry monitoring. Troponins were not indicative of ACS.. Etiology of syncope is due to dehydration.  2. Acute kidney injury in the setting of dehydration and playing tennis outdoors. Patient's creatinine has improved. Patient encouraged to hydrate himself while out in the heat  3. Elevated troponin due to acute kidney injury with poor renal clearance. Troponins were flat at 0.043. Patient ruled out for ACS  4. Hyperlipidemia: Patient may now resume statin.   DISCHARGE CONDITIONS AND DIET:   Patient stable for discharge on regular diet  CONSULTS OBTAINED:    DRUG ALLERGIES:  No Known Allergies  DISCHARGE MEDICATIONS:   Current Discharge Medication List    CONTINUE these medications which have NOT CHANGED   Details  aspirin EC 81 MG tablet Take 81 mg by mouth daily.    Multiple Vitamins tablet Take 1 tablet by mouth daily.     Nutritional Supplements (IMMUNE ENHANCE) TABS Take 1 tablet by mouth daily.     simvastatin (ZOCOR) 40 MG tablet take 1 tablet by mouth once  daily Qty: 30 tablet, Refills: 12      STOP taking these medications     sildenafil (REVATIO) 20 MG tablet      Vitamin D, Ergocalciferol, (DRISDOL) 50000 units CAPS capsule           Today   CHIEF COMPLAINT:   Patient doing well this morning. No muscular skeletal cramps   VITAL SIGNS:  Blood pressure 122/75, pulse (!) 59, temperature 98 F (36.7 C), temperature source Oral, resp. rate 18, height 6' (1.829 m), weight 71.8 kg (158 lb 4.8 oz), SpO2 100 %.   REVIEW OF SYSTEMS:  Review of Systems  Constitutional: Negative.  Negative for chills, fever and malaise/fatigue.  HENT: Negative.  Negative for ear discharge, ear pain, hearing loss, nosebleeds and sore throat.   Eyes: Negative.  Negative for blurred vision and pain.  Respiratory: Negative.  Negative for cough, hemoptysis, shortness of breath and wheezing.   Cardiovascular: Negative.  Negative for chest pain, palpitations and leg swelling.  Gastrointestinal: Negative.  Negative for abdominal pain, blood in stool, diarrhea, nausea and vomiting.  Genitourinary: Negative.  Negative for dysuria.  Musculoskeletal: Negative.  Negative for back pain.  Skin: Negative.   Neurological: Negative for dizziness, tremors, speech change, focal weakness, seizures and headaches.  Endo/Heme/Allergies: Negative.  Does not bruise/bleed easily.  Psychiatric/Behavioral: Negative.  Negative for depression, hallucinations and suicidal ideas.     PHYSICAL EXAMINATION:  GENERAL:  71 y.o.-year-old patient lying in the bed with no acute distress.  NECK:  Supple, no  jugular venous distention. No thyroid enlargement, no tenderness.  LUNGS: Normal breath sounds bilaterally, no wheezing, rales,rhonchi  No use of accessory muscles of respiration.  CARDIOVASCULAR: S1, S2 normal. No murmurs, rubs, or gallops.  ABDOMEN: Soft, non-tender, non-distended. Bowel sounds present. No organomegaly or mass.  EXTREMITIES: No pedal edema, cyanosis, or  clubbing.  PSYCHIATRIC: The patient is alert and oriented x 3.  SKIN: No obvious rash, lesion, or ulcer.   DATA REVIEW:   CBC  Recent Labs Lab 01/30/17 0454  WBC 5.5  HGB 14.3  HCT 41.7  PLT 164    Chemistries   Recent Labs Lab 01/30/17 0454  NA 139  K 4.4  CL 107  CO2 27  GLUCOSE 94  BUN 17  CREATININE 1.31*  CALCIUM 9.0    Cardiac Enzymes  Recent Labs Lab 01/29/17 1727 01/29/17 2258 01/30/17 0454  TROPONINI 0.04* 0.04* 0.04*    Microbiology Results  @MICRORSLT48 @  RADIOLOGY:  No results found.    Current Discharge Medication List    CONTINUE these medications which have NOT CHANGED   Details  aspirin EC 81 MG tablet Take 81 mg by mouth daily.    Multiple Vitamins tablet Take 1 tablet by mouth daily.     Nutritional Supplements (IMMUNE ENHANCE) TABS Take 1 tablet by mouth daily.     simvastatin (ZOCOR) 40 MG tablet take 1 tablet by mouth once daily Qty: 30 tablet, Refills: 12      STOP taking these medications     sildenafil (REVATIO) 20 MG tablet      Vitamin D, Ergocalciferol, (DRISDOL) 50000 units CAPS capsule           Management plans discussed with the patient and he is in agreement. Stable for discharge home  Patient should follow up with pcp  CODE STATUS:     Code Status Orders        Start     Ordered   01/29/17 1916  Full code  Continuous     01/29/17 1916    Code Status History    Date Active Date Inactive Code Status Order ID Comments User Context   This patient has a current code status but no historical code status.    Advance Directive Documentation     Most Recent Value  Type of Advance Directive  Living will  Pre-existing out of facility DNR order (yellow form or pink MOST form)  -  "MOST" Form in Place?  -      TOTAL TIME TAKING CARE OF THIS PATIENT: 38 minutes.    Note: This dictation was prepared with Dragon dictation along with smaller phrase technology. Any transcriptional errors that  result from this process are unintentional.  Melyna Huron M.D on 01/30/2017 at 8:17 AM  Between 7am to 6pm - Pager - [phone] After 6pm go to [email or web address] - password [email or web address]  Office  [phone]  CC: Primary care physician; Jerrol Banana., MD

## 2017-02-05 ENCOUNTER — Ambulatory Visit (INDEPENDENT_AMBULATORY_CARE_PROVIDER_SITE_OTHER): Payer: BLUE CROSS/BLUE SHIELD | Admitting: Family Medicine

## 2017-02-05 VITALS — BP 118/62 | HR 58 | Temp 97.7°F | Resp 12 | Wt 162.0 lb

## 2017-02-05 DIAGNOSIS — T671XXD Heat syncope, subsequent encounter: Secondary | ICD-10-CM

## 2017-02-05 DIAGNOSIS — E559 Vitamin D deficiency, unspecified: Secondary | ICD-10-CM | POA: Diagnosis not present

## 2017-02-05 DIAGNOSIS — E860 Dehydration: Secondary | ICD-10-CM | POA: Diagnosis not present

## 2017-02-05 DIAGNOSIS — Z09 Encounter for follow-up examination after completed treatment for conditions other than malignant neoplasm: Secondary | ICD-10-CM

## 2017-02-05 NOTE — Progress Notes (Signed)
Marc Munoz  MRN: [id number] DOB: 02/01/1946  Subjective:  HPI   Patient is here for hospital follow up. Dates of admission and discharge was 01/29/17-01/30/17. Admission DX: Syncope, hyperkalemia, elevated troponin, dehydration, heat cramp. Discharge D/C: Acute renal failure.  Dehydration: syncope due to dehydration. Elevated troponin: not indicative of ACS-due to kidney injury.  Patient states he played tennis tournament Labor day weekend and tried to stay hydrated but was not enough. He also teaches tennis. He developed severe cramping and then passed out but his son caught him. He did hit his knee on the left on the stairs but not head injury. He is feeling much better. Cramping resolved. At the hospital patient was advised to stop Sildenafil and Vitamin D.   Patient Active Problem List   Diagnosis Date Noted  . Acute renal failure (ARF) ([place]) 01/29/2017  . ED (erectile dysfunction) 07/14/2015  . DD (diverticular disease) 06/03/2015  . H/O malignant neoplasm of skin 06/03/2015  . HLD (hyperlipidemia) 06/03/2015  . Hernia, inguinal, left 06/03/2015  . Merkel cell cancer ([place]) 06/03/2015  . Arthropathy of temporomandibular joint 06/03/2015  . Avitaminosis D 06/03/2015  . Peripheral vascular disease ([place]) 05/08/2014    Past Medical History:  Diagnosis Date  . Cancer ([place])    melanoma  . Hyperlipemia     Social History   Social History  . Marital status: Married    Spouse name: Manuela Schwartz  . Number of children: 1  . Years of education: 72   Occupational History  .  [place] History Main Topics  . Smoking status: Never Smoker  . Smokeless tobacco: Never Used  . Alcohol use 0.6 oz/week    1 Cans of beer per week     Comment: occasional  . Drug use: No  . Sexual activity: Yes   Other Topics Concern  . Not on file   Social History Narrative  . No narrative on file    Outpatient Encounter Prescriptions as of 02/05/2017  Medication Sig  .  aspirin EC 81 MG tablet Take 81 mg by mouth daily.  . Multiple Vitamins tablet Take 1 tablet by mouth daily.   . Nutritional Supplements (IMMUNE ENHANCE) TABS Take 1 tablet by mouth daily.   . simvastatin (ZOCOR) 40 MG tablet take 1 tablet by mouth once daily   No facility-administered encounter medications on file as of 02/05/2017.     No Known Allergies  Review of Systems  Constitutional: Negative.   Respiratory: Negative.   Cardiovascular: Negative.   Gastrointestinal: Negative.   Musculoskeletal: Negative.   Neurological: Negative.   Endo/Heme/Allergies: Negative.   Psychiatric/Behavioral: Negative.     Objective:  BP 118/62   Pulse (!) 58   Temp 97.7 F (36.5 C)   Resp 12   Wt 162 lb (73.5 kg)   BMI 21.97 kg/m   Physical Exam  Constitutional: He is oriented to person, place, and time and well-developed, well-nourished, and in no distress.  HENT:  Head: Normocephalic and atraumatic.  Eyes: Conjunctivae are normal. No scleral icterus.  Neck: No thyromegaly present.  Cardiovascular: Normal rate, regular rhythm and normal heart sounds.   Pulmonary/Chest: Effort normal and breath sounds normal.  Abdominal: Soft.  Neurological: He is alert and oriented to person, place, and time. Gait normal. GCS score is 15.  Skin: Skin is warm and dry.  Very fair skin.  Psychiatric: Mood, memory, affect and judgment normal.    Assessment and Plan :  1. Hospital discharge follow-up Records reviewed. Medication reviewed and reconciled.  2. Dehydration Resolved. Patient feels much better. Follow as needed.  3. Heat syncope, subsequent encounter No further workup necessary. 4. Avitaminosis D Patient advised to take Vitamin D 1 tablet/capsule once a month and then will re check level in 6 months to see if he still needs RX Vitamin D.  HPI, Exam and A&P transcribed by Tiffany Kocher, RMA under direction and in the presence of Miguel Aschoff, MD. I have done the exam and  reviewed the chart and it is accurate to the best of my knowledge. Development worker, community has been used and  any errors in dictation or transcription are unintentional. Miguel Aschoff M.D. [facility]

## 2017-02-06 DIAGNOSIS — D225 Melanocytic nevi of trunk: Secondary | ICD-10-CM | POA: Diagnosis not present

## 2017-02-06 DIAGNOSIS — L570 Actinic keratosis: Secondary | ICD-10-CM | POA: Diagnosis not present

## 2017-02-14 ENCOUNTER — Encounter: Payer: Self-pay | Admitting: Family Medicine

## 2017-05-02 DIAGNOSIS — Z6822 Body mass index (BMI) 22.0-22.9, adult: Secondary | ICD-10-CM | POA: Diagnosis not present

## 2017-05-02 DIAGNOSIS — C4330 Malignant melanoma of unspecified part of face: Secondary | ICD-10-CM | POA: Diagnosis not present

## 2017-05-10 DIAGNOSIS — D044 Carcinoma in situ of skin of scalp and neck: Secondary | ICD-10-CM | POA: Diagnosis not present

## 2017-05-10 DIAGNOSIS — L570 Actinic keratosis: Secondary | ICD-10-CM | POA: Diagnosis not present

## 2017-05-10 DIAGNOSIS — D485 Neoplasm of uncertain behavior of skin: Secondary | ICD-10-CM | POA: Diagnosis not present

## 2017-05-10 DIAGNOSIS — Z85820 Personal history of malignant melanoma of skin: Secondary | ICD-10-CM | POA: Diagnosis not present

## 2017-05-19 ENCOUNTER — Other Ambulatory Visit: Payer: Self-pay | Admitting: Family Medicine

## 2017-06-25 DIAGNOSIS — E782 Mixed hyperlipidemia: Secondary | ICD-10-CM | POA: Diagnosis not present

## 2017-06-25 DIAGNOSIS — R001 Bradycardia, unspecified: Secondary | ICD-10-CM | POA: Diagnosis not present

## 2017-06-25 DIAGNOSIS — I739 Peripheral vascular disease, unspecified: Secondary | ICD-10-CM | POA: Diagnosis not present

## 2017-06-26 DIAGNOSIS — L905 Scar conditions and fibrosis of skin: Secondary | ICD-10-CM | POA: Diagnosis not present

## 2017-06-26 DIAGNOSIS — D044 Carcinoma in situ of skin of scalp and neck: Secondary | ICD-10-CM | POA: Diagnosis not present

## 2017-06-28 ENCOUNTER — Telehealth: Payer: Self-pay

## 2017-06-28 NOTE — Telephone Encounter (Signed)
Pt's wife Manuela Schwartz called upset that pt was contacted to schedule Medicare AWV and pt doesn't have Medicare. Manuela Schwartz stated pt has [place] and is still currently working full time. Manuela Schwartz requested pt be removed from the list for AWV until he is actually on Medicare. Thanks TNP

## 2017-06-28 NOTE — Telephone Encounter (Signed)
LMTCB if interested in scheduling an AWV with [facility] prior to CPE on 07/25/17. -MM

## 2017-06-29 NOTE — Telephone Encounter (Signed)
I noticed that Medicare was listed as primary in the patient header while I was in the appt desk but when I went to pt registration to see what insurance was on file the patient header changed and the only insurance I saw was [employer]. I sent Judson Roch a Staff Message in [place] and asked her to check on pt's insurance because pt's wife was concerned that the hospital still had Medicare on pt's records b/c they listed Medicare as his insurance during a hospital admission in September 2018 and pt's wife tried to tell them pt doesn't have Medicare. Thanks TNP

## 2017-06-29 NOTE — Telephone Encounter (Signed)
Per chart pts primary insurance is listed as Medicare and secondary is BCBS. If this is incorrect should I send this to Judson Roch to be fixed? Thanks, MM.

## 2017-06-29 NOTE — Telephone Encounter (Signed)
Thank you for looking into this and notifying Judson Roch! -MM

## 2017-07-03 DIAGNOSIS — C4321 Malignant melanoma of right ear and external auricular canal: Secondary | ICD-10-CM | POA: Diagnosis not present

## 2017-07-03 DIAGNOSIS — Z800 Family history of malignant neoplasm of digestive organs: Secondary | ICD-10-CM | POA: Diagnosis not present

## 2017-07-03 DIAGNOSIS — Z6822 Body mass index (BMI) 22.0-22.9, adult: Secondary | ICD-10-CM | POA: Diagnosis not present

## 2017-07-03 DIAGNOSIS — C439 Malignant melanoma of skin, unspecified: Secondary | ICD-10-CM | POA: Diagnosis not present

## 2017-07-03 DIAGNOSIS — Z9889 Other specified postprocedural states: Secondary | ICD-10-CM | POA: Diagnosis not present

## 2017-07-03 DIAGNOSIS — Z7982 Long term (current) use of aspirin: Secondary | ICD-10-CM | POA: Diagnosis not present

## 2017-07-03 DIAGNOSIS — C4A30 Merkel cell carcinoma of unspecified part of face: Secondary | ICD-10-CM | POA: Diagnosis not present

## 2017-07-03 DIAGNOSIS — Z79899 Other long term (current) drug therapy: Secondary | ICD-10-CM | POA: Diagnosis not present

## 2017-07-03 DIAGNOSIS — L905 Scar conditions and fibrosis of skin: Secondary | ICD-10-CM | POA: Diagnosis not present

## 2017-07-25 ENCOUNTER — Encounter: Payer: Self-pay | Admitting: Family Medicine

## 2017-07-25 ENCOUNTER — Ambulatory Visit (INDEPENDENT_AMBULATORY_CARE_PROVIDER_SITE_OTHER): Payer: BLUE CROSS/BLUE SHIELD | Admitting: Family Medicine

## 2017-07-25 VITALS — BP 120/64 | HR 56 | Temp 97.6°F | Resp 16 | Ht 71.0 in | Wt 163.0 lb

## 2017-07-25 DIAGNOSIS — N179 Acute kidney failure, unspecified: Secondary | ICD-10-CM | POA: Diagnosis not present

## 2017-07-25 DIAGNOSIS — Z1211 Encounter for screening for malignant neoplasm of colon: Secondary | ICD-10-CM | POA: Diagnosis not present

## 2017-07-25 DIAGNOSIS — Z126 Encounter for screening for malignant neoplasm of bladder: Secondary | ICD-10-CM | POA: Diagnosis not present

## 2017-07-25 DIAGNOSIS — E7800 Pure hypercholesterolemia, unspecified: Secondary | ICD-10-CM | POA: Diagnosis not present

## 2017-07-25 DIAGNOSIS — Z0000 Encounter for general adult medical examination without abnormal findings: Secondary | ICD-10-CM

## 2017-07-25 LAB — POCT URINALYSIS DIPSTICK
Appearance: NORMAL
Bilirubin, UA: NEGATIVE
Blood, UA: NEGATIVE
Glucose, UA: NEGATIVE
KETONES UA: NEGATIVE
Leukocytes, UA: NEGATIVE
NITRITE UA: NEGATIVE
Odor: NORMAL
PH UA: 6.5 (ref 5.0–8.0)
PROTEIN UA: NEGATIVE
Spec Grav, UA: 1.01 (ref 1.010–1.025)
UROBILINOGEN UA: 0.2 U/dL

## 2017-07-25 LAB — IFOBT (OCCULT BLOOD): IFOBT: NEGATIVE

## 2017-07-25 NOTE — Progress Notes (Signed)
Patient: Marc Munoz, Male    DOB: 1945/12/05, 72 y.o.   MRN: [id number] Visit Date: 07/25/2017  Today's Provider: Wilhemena Durie, MD   Chief Complaint  Patient presents with  . Annual Exam   Subjective:    Annual physical exam Marc Munoz is a 72 y.o. male who presents today for health maintenance and complete physical. He feels well. He reports exercising daily, he plays a lot of tennis. He reports he is sleeping well. 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  ----------------------------------------------------------------- Colonoscopy- 05/03/15 Dr. Vira Agar, Diverticulosis, hemorrhiods  Review of Systems  Constitutional: Negative.   HENT: Negative.   Eyes: Negative.   Respiratory: Negative.   Cardiovascular: Negative.   Gastrointestinal: Negative.   Endocrine: Negative.   Genitourinary: Negative.   Musculoskeletal: Negative.   Skin: Negative.   Allergic/Immunologic: Negative.   Neurological: Negative.   Hematological: Negative.   Psychiatric/Behavioral: Negative.     Social History      He  reports that  has never smoked. he has never used smokeless tobacco. He reports that he drinks about 0.6 oz of alcohol per week. He reports that he does not use drugs.       Social History   Socioeconomic History  . Marital status: Married    Spouse name: Manuela Schwartz  . Number of children: 1  . Years of education: 65  . Highest education level: None  Social Needs  . Financial resource strain: None  . Food insecurity - worry: None  . Food insecurity - inability: None  . Transportation needs - medical: None  . Transportation needs - non-medical: None  Occupational History    Employer: [employer]  Tobacco Use  . Smoking status: Never Smoker  . Smokeless tobacco: Never Used  Substance and Sexual Activity  . Alcohol use: Yes    Alcohol/week: 0.6 oz    Types: 1 Cans of beer per week    Comment: occasional  . Drug use: No  . Sexual activity:  Yes  Other Topics Concern  . None  Social History Narrative  . None    Past Medical History:  Diagnosis Date  . Cancer ([place])    melanoma  . Hyperlipemia      Patient Active Problem List   Diagnosis Date Noted  . Acute renal failure (ARF) ([place]) 01/29/2017  . ED (erectile dysfunction) 07/14/2015  . DD (diverticular disease) 06/03/2015  . H/O malignant neoplasm of skin 06/03/2015  . HLD (hyperlipidemia) 06/03/2015  . Hernia, inguinal, left 06/03/2015  . Merkel cell cancer ([place]) 06/03/2015  . Arthropathy of temporomandibular joint 06/03/2015  . Avitaminosis D 06/03/2015  . Peripheral vascular disease ([place]) 05/08/2014    Past Surgical History:  Procedure Laterality Date  . COLONOSCOPY N/A 05/03/2015   Procedure: COLONOSCOPY;  Surgeon: Manya Silvas, MD;  [facility]: [facility]COPY;  Service: Endoscopy;  Laterality: N/A;  . HERNIA REPAIR    . MELANOMA EXCISION      Family History        Family Status  Relation Name Status  . Brother  Deceased  . Mother  Deceased  . Father  Deceased        His family history includes Arthritis in his father; COPD in his father; Colon cancer in his father; Heart disease in his mother; Hypertension in his father and mother; Stroke in his brother.      No Known Allergies   Current Outpatient Medications:  .  Multiple Vitamins tablet, Take 1 tablet by mouth daily. , Disp: , Rfl:  .  Nutritional Supplements (IMMUNE ENHANCE) TABS, Take 1 tablet by mouth daily. , Disp: , Rfl:  .  simvastatin (ZOCOR) 40 MG tablet, take 1 tablet by mouth once daily, Disp: 30 tablet, Rfl: 12 .  Vitamin D, Ergocalciferol, (DRISDOL) 50000 units CAPS capsule, take 1 capsule by mouth every week, Disp: 4 capsule, Rfl: 12 .  aspirin EC 81 MG tablet, Take 81 mg by mouth daily., Disp: , Rfl:    Patient Care Team: Jerrol Banana., MD as PCP - General (Family Medicine)      Objective:   Vitals: BP 120/64 (BP Location: Left Arm, Patient Position: Sitting,  Cuff Size: Normal)   Pulse (!) 56   Temp 97.6 F (36.4 C) (Oral)   Resp 16   Ht 5\' 11"  (1.803 m)   Wt 163 lb (73.9 kg)   BMI 22.73 kg/m    Vitals:   07/25/17 0841  BP: 120/64  Pulse: (!) 56  Resp: 16  Temp: 97.6 F (36.4 C)  TempSrc: Oral  Weight: 163 lb (73.9 kg)  Height: 5\' 11"  (1.803 m)     Physical Exam  Constitutional: He is oriented to person, place, and time. He appears well-developed and well-nourished.  HENT:  Head: Normocephalic and atraumatic.  Right Ear: External ear normal.  Left Ear: External ear normal.  Nose: Nose normal.  Mouth/Throat: Oropharynx is clear and moist.  Eyes: Conjunctivae and EOM are normal. Pupils are equal, round, and reactive to light. No scleral icterus.  Neck: No thyromegaly present.  Cardiovascular: Normal rate, regular rhythm, normal heart sounds and intact distal pulses.  Pulmonary/Chest: Effort normal and breath sounds normal.  Abdominal: Soft.  Genitourinary: Rectum normal, prostate normal and penis normal.  Musculoskeletal: Normal range of motion.  Lymphadenopathy:    He has no cervical adenopathy.  Neurological: He is alert and oriented to person, place, and time.  Skin: Skin is warm and dry.  Fair skin.  Psychiatric: He has a normal mood and affect. His behavior is normal. Judgment and thought content normal.     Depression Screen PHQ 2/9 Scores 07/25/2017 07/19/2016 07/14/2015  PHQ - 2 Score 0 0 0  PHQ- 9 Score 0 - -      Assessment & Plan:     Routine Health Maintenance and Physical Exam  Exercise Activities and Dietary recommendations Goals    None      Immunization History  Administered Date(s) Administered  . Influenza, High Dose Seasonal PF 03/11/2016  . Pneumococcal Conjugate-13 07/08/2014  . Pneumococcal Polysaccharide-23 06/11/2012  . Tdap 06/11/2012  . Zoster 06/11/2012    Health Maintenance  Topic Date Due  . INFLUENZA VACCINE  12/27/2016  . TETANUS/TDAP  06/11/2022  . COLONOSCOPY   05/02/2025  . Hepatitis C Screening  Completed  . PNA vac Low Risk Adult  Completed     Discussed health benefits of physical activity, and encouraged him to engage in regular exercise appropriate for his age and condition.    --------------------------------------------------------------------   I have done the exam and reviewed the above chart and it is accurate to the best of my knowledge. Development worker, community has been used in this note in any air is in the dictation or transcription are unintentional.  Wilhemena Durie, MD  [place]

## 2017-08-28 ENCOUNTER — Telehealth: Payer: Self-pay | Admitting: Family Medicine

## 2017-08-28 NOTE — Telephone Encounter (Signed)
Pt had blood drawn on March 15 and has not heard anything back  His call is [phone]  Leave message   Thanks Con Memos

## 2017-08-28 NOTE — Telephone Encounter (Signed)
Spoke with pt wife. We have not gotten the lab results back yet. She reports that he gets his labs drawn through the city. She would let pt know and then get [place] a copy of the labs.

## 2017-08-29 ENCOUNTER — Telehealth: Payer: Self-pay | Admitting: Emergency Medicine

## 2017-08-29 NOTE — Telephone Encounter (Signed)
Pt wife brought lab work by. Dr. Rosanna Randy reviewed labs and labs were ok, good. Pt advised. However labs have the wrong date of birth on the report. Pt is going to get in contact with the city where he got the labs and see what date of birth they have for him and see where the error occurred. We have to have this changed before we can get this scanned into the chart.

## 2017-09-05 ENCOUNTER — Telehealth: Payer: Self-pay | Admitting: Family Medicine

## 2017-09-05 NOTE — Telephone Encounter (Signed)
LMTCB. We have not received the new set of lab work to my knowledge yet. Could still be in the fax cue though.

## 2017-09-05 NOTE — Telephone Encounter (Signed)
Pt's wife Manuela Schwartz stated that pt reached out to [facility] to get his DOB corrected and requested new lab result report to be sent to our office so it can be scanned into pt's record. Manuela Schwartz is requesting a call back to discuss 1. If we received the update results. 2. Manuela Schwartz would like to know which of pt's lab have to do with liver function. Please advise. Thanks TNP

## 2017-09-06 NOTE — Telephone Encounter (Signed)
Spoke with pt wife and informed her of the questions she had about the live functions and that we had not gotten the correct labs yet and she would get back in touch with them about getting the labs to [place]. If we had not gotten them back by Monday she would get pt to bring a copy by the office.

## 2017-09-12 ENCOUNTER — Other Ambulatory Visit: Payer: Self-pay | Admitting: Family Medicine

## 2017-09-25 DIAGNOSIS — L570 Actinic keratosis: Secondary | ICD-10-CM | POA: Diagnosis not present

## 2017-09-25 DIAGNOSIS — Z08 Encounter for follow-up examination after completed treatment for malignant neoplasm: Secondary | ICD-10-CM | POA: Diagnosis not present

## 2017-09-25 DIAGNOSIS — C4441 Basal cell carcinoma of skin of scalp and neck: Secondary | ICD-10-CM | POA: Diagnosis not present

## 2017-09-25 DIAGNOSIS — D485 Neoplasm of uncertain behavior of skin: Secondary | ICD-10-CM | POA: Diagnosis not present

## 2017-09-25 DIAGNOSIS — X32XXXA Exposure to sunlight, initial encounter: Secondary | ICD-10-CM | POA: Diagnosis not present

## 2017-09-25 DIAGNOSIS — Z85828 Personal history of other malignant neoplasm of skin: Secondary | ICD-10-CM | POA: Diagnosis not present

## 2017-09-25 DIAGNOSIS — Z85820 Personal history of malignant melanoma of skin: Secondary | ICD-10-CM | POA: Diagnosis not present

## 2017-09-25 DIAGNOSIS — C44619 Basal cell carcinoma of skin of left upper limb, including shoulder: Secondary | ICD-10-CM | POA: Diagnosis not present

## 2017-10-31 DIAGNOSIS — C439 Malignant melanoma of skin, unspecified: Secondary | ICD-10-CM | POA: Diagnosis not present

## 2017-10-31 DIAGNOSIS — Z923 Personal history of irradiation: Secondary | ICD-10-CM | POA: Diagnosis not present

## 2017-10-31 DIAGNOSIS — E7800 Pure hypercholesterolemia, unspecified: Secondary | ICD-10-CM | POA: Diagnosis not present

## 2017-10-31 DIAGNOSIS — Z800 Family history of malignant neoplasm of digestive organs: Secondary | ICD-10-CM | POA: Diagnosis not present

## 2017-10-31 DIAGNOSIS — Z6822 Body mass index (BMI) 22.0-22.9, adult: Secondary | ICD-10-CM | POA: Diagnosis not present

## 2017-10-31 DIAGNOSIS — Z9221 Personal history of antineoplastic chemotherapy: Secondary | ICD-10-CM | POA: Diagnosis not present

## 2017-11-01 DIAGNOSIS — H109 Unspecified conjunctivitis: Secondary | ICD-10-CM | POA: Diagnosis not present

## 2017-11-14 DIAGNOSIS — C4441 Basal cell carcinoma of skin of scalp and neck: Secondary | ICD-10-CM | POA: Diagnosis not present

## 2017-11-27 DIAGNOSIS — C44519 Basal cell carcinoma of skin of other part of trunk: Secondary | ICD-10-CM | POA: Diagnosis not present

## 2018-01-10 ENCOUNTER — Other Ambulatory Visit: Payer: Self-pay

## 2018-01-10 DIAGNOSIS — N400 Enlarged prostate without lower urinary tract symptoms: Secondary | ICD-10-CM

## 2018-01-10 MED ORDER — TAMSULOSIN HCL 0.4 MG PO CAPS: 0.4000 mg | ORAL_CAPSULE | Freq: Every day | ORAL | 3 refills | Status: DC

## 2018-01-10 NOTE — Progress Notes (Signed)
Sent in per Dr. Gilbert.  

## 2018-02-07 DIAGNOSIS — D485 Neoplasm of uncertain behavior of skin: Secondary | ICD-10-CM | POA: Diagnosis not present

## 2018-02-07 DIAGNOSIS — X32XXXA Exposure to sunlight, initial encounter: Secondary | ICD-10-CM | POA: Diagnosis not present

## 2018-02-07 DIAGNOSIS — Z85820 Personal history of malignant melanoma of skin: Secondary | ICD-10-CM | POA: Diagnosis not present

## 2018-02-07 DIAGNOSIS — L570 Actinic keratosis: Secondary | ICD-10-CM | POA: Diagnosis not present

## 2018-02-07 DIAGNOSIS — Z85828 Personal history of other malignant neoplasm of skin: Secondary | ICD-10-CM | POA: Diagnosis not present

## 2018-02-07 DIAGNOSIS — D0471 Carcinoma in situ of skin of right lower limb, including hip: Secondary | ICD-10-CM | POA: Diagnosis not present

## 2018-02-07 DIAGNOSIS — Z08 Encounter for follow-up examination after completed treatment for malignant neoplasm: Secondary | ICD-10-CM | POA: Diagnosis not present

## 2018-02-19 ENCOUNTER — Ambulatory Visit (INDEPENDENT_AMBULATORY_CARE_PROVIDER_SITE_OTHER): Payer: BLUE CROSS/BLUE SHIELD | Admitting: Sports Medicine

## 2018-02-19 ENCOUNTER — Encounter: Payer: Self-pay | Admitting: Sports Medicine

## 2018-02-19 DIAGNOSIS — M7662 Achilles tendinitis, left leg: Secondary | ICD-10-CM

## 2018-02-19 MED ORDER — NITROGLYCERIN 0.2 MG/HR TD PT24: MEDICATED_PATCH | TRANSDERMAL | 0 refills | Status: DC

## 2018-02-19 NOTE — Patient Instructions (Signed)
Do Achilles exercises twice daily  Use NTG patches - start with 1/4 patch  Nitroglycerin Protocol   Apply 1/4 nitroglycerin patch to affected area daily.  Change position of patch within the affected area every 24 hours.  You may experience a headache during the first 1-2 weeks of using the patch, these should subside.  If you experience headaches after beginning nitroglycerin patch treatment, you may take your preferred over the counter pain reliever.  Another side effect of the nitroglycerin patch is skin irritation or rash related to patch adhesive.  Please notify our office if you develop more severe headaches or rash, and stop the patch.  Tendon healing with nitroglycerin patch may require 12 to 24 weeks depending on the extent of injury.  Men should not use if taking Viagra, Cialis, or Levitra.   Do not use if you have migraines or rosacea.   Don't play through severe pain  Your AT was 0.99 on left and 0.59 on RT  Ice massage 3 x day  Use insole lift in shoes  Repeat scan in 6 weeks

## 2018-02-19 NOTE — Assessment & Plan Note (Signed)
alfredson exercise protocol  NTG patches  Rest from play until less pain on rehab exercises  Shoe insert with heel lift  Reck 6 wks

## 2018-02-19 NOTE — Progress Notes (Signed)
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 Range of motion is full in all directions. Strength is 5/5 in all directions. Stable lateral and medial ligaments; squeeze test and kleiger test unremarkable; Talar dome nontender; No pain at base of 5th MT; No tenderness over cuboid; No tenderness over N spot or navicular prominence No tenderness on posterior aspects of lateral and medial malleolus No sign of peroneal tendon subluxations; Negative tarsal tunnel tinel's Able to walk 4 steps.  Only mild tenderness to palpation at the insertion of the ankle into the calcaneus  Ultrasound of left Achilles tendon The tendon is thickened at 0.99 cm There is a hypoechoic gap at the deep insertion There is calcification at the insertion There is some limited neovascular activity Transverse scan shows the swelling affects 20% of the tendon Right Achilles tendon is compared and is only 0.59 thick  Impression Achilles tendinopathy with a small insertional tear and significant swelling  Ultrasound and interpretation by Wolfgang Phoenix. [place] Alar, MD

## 2018-03-12 DIAGNOSIS — C44722 Squamous cell carcinoma of skin of right lower limb, including hip: Secondary | ICD-10-CM | POA: Diagnosis not present

## 2018-03-12 DIAGNOSIS — D0471 Carcinoma in situ of skin of right lower limb, including hip: Secondary | ICD-10-CM | POA: Diagnosis not present

## 2018-04-04 ENCOUNTER — Encounter: Payer: Self-pay | Admitting: Sports Medicine

## 2018-04-04 ENCOUNTER — Ambulatory Visit: Payer: Self-pay

## 2018-04-04 ENCOUNTER — Ambulatory Visit: Payer: BLUE CROSS/BLUE SHIELD | Admitting: Sports Medicine

## 2018-04-04 VITALS — BP 153/63 | Ht 72.0 in | Wt 165.0 lb

## 2018-04-04 DIAGNOSIS — M25572 Pain in left ankle and joints of left foot: Secondary | ICD-10-CM | POA: Diagnosis not present

## 2018-04-04 NOTE — Progress Notes (Signed)
  Marc Munoz - 72 y.o. male MRN [id number]  Date of birth: 1945-07-28    SUBJECTIVE:      Chief Complaint:/ HPI:  Patient presents for follow up of his left achilles tendinitis. He reports that he has noticed some improvement in function with his daily exercises. He is able to use a stationary bike without pain. He reports that he has no pain when doing calf raises with both legs, it is only when doing solo left leg calf raises he experiences pain. He states that there is a constant background pain in his heel for which he takes aleve and ibuprofen when the pain worsens every so often.    ROS:     No other join pain currently No swelling of the ankle  PERTINENT  PMH / PSH FH / / SH:  Past Medical, Surgical, Social, and Family History Reviewed & Updated in the EMR.  Pertinent findings include:  History of skin cancer  OBJECTIVE: BP (!) 153/63   Ht 6' (1.829 m)   Wt 165 lb (74.8 kg)   BMI 22.38 kg/m   Physical Exam:  Vital signs are reviewed.  GEN: Alert and oriented, NAD Pulm: Breathing unlabored PSY: normal mood, congruent affect  MSK: No visible erythema or swelling Full range of motion of left ankle Strength 5/5 No pain or tenderness to palpation of his ankle or achilles tendon Minimal wasting of calf muscle on left leg  Ultrasound of Left Achilles Tendon  The amount of hypoechoic change at insertion has lessened The retrocalcaneal bursa is no longer swollen Width is down to 0.79 from 0.99 cm. (Note RT AT is 0.56) Calcifications at insertion Neovessel activity is increased  Impression:  Improved Achilles tendinopathy  Ultrasound and interpretation by Wolfgang Phoenix. Desare Duddy, MD  ASSESSMENT & PLAN:  1. Achilles tendinitis -continue exercises at home, patient will progress to light movement sided to side on the tennis court -physical exam findings show improvement from initial visit -thickness of Achilles tendon is less than first ultrasound Increase NTG to 1/2  patch -follow up in 6 weeks

## 2018-04-29 ENCOUNTER — Other Ambulatory Visit: Payer: Self-pay | Admitting: Family Medicine

## 2018-04-29 ENCOUNTER — Other Ambulatory Visit: Payer: Self-pay | Admitting: *Deleted

## 2018-04-29 DIAGNOSIS — N400 Enlarged prostate without lower urinary tract symptoms: Secondary | ICD-10-CM

## 2018-04-29 MED ORDER — NITROGLYCERIN 0.2 MG/HR TD PT24: MEDICATED_PATCH | TRANSDERMAL | 0 refills | Status: DC

## 2018-05-01 DIAGNOSIS — D0321 Melanoma in situ of right ear and external auricular canal: Secondary | ICD-10-CM | POA: Diagnosis not present

## 2018-05-01 DIAGNOSIS — Z6823 Body mass index (BMI) 23.0-23.9, adult: Secondary | ICD-10-CM | POA: Diagnosis not present

## 2018-05-01 DIAGNOSIS — C439 Malignant melanoma of skin, unspecified: Secondary | ICD-10-CM | POA: Diagnosis not present

## 2018-05-14 ENCOUNTER — Encounter: Payer: Self-pay | Admitting: Sports Medicine

## 2018-05-14 ENCOUNTER — Ambulatory Visit (INDEPENDENT_AMBULATORY_CARE_PROVIDER_SITE_OTHER): Payer: BLUE CROSS/BLUE SHIELD | Admitting: Sports Medicine

## 2018-05-14 DIAGNOSIS — M7662 Achilles tendinitis, left leg: Secondary | ICD-10-CM | POA: Diagnosis not present

## 2018-05-14 NOTE — Patient Instructions (Signed)
Achilles  Ultrasound is still improved from first visit  Use 1/2 patch NTG  Exercises M/W/F:  Do the ones on a step twice a day / 3 sets 15/ knee straight and knee bent  Tu/Thurs/Sat:  Do progressive weights  Seated heel raise/ weight over knees Heel raise standing - build up weight - 50 to 60 to 80 once OK Step up holding bar at 50 All exercises 3 sets of 15  Ice massage technique Styrofoam cup/ freeze with water Do a pressure massage for 5 mins. A couple a day  Start with medium body helix ankle compression  Use same insoles  If not a lot of progress in 1 month let's make custom orthotics  3 months come back

## 2018-05-14 NOTE — Progress Notes (Signed)
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  PAST History Merkel cell carcinoma of the face 2005 treated with chemotherapy Recurrence of Merkel cell 2006 treated with radiation right shoulder History of melanoma removal  ROS No swelling No numbness  Physical exam Athletic thin white male in no acute distress BP 138/66   Ht 6' (1.829 m)   Wt 165 lb (74.8 kg)   BMI 22.38 kg/m    Good plantar and dorsiflexion of the left foot Thickening of the left Achilles tendon on palpation Tenderness to palpation at the insertion to the calcaneus No visible swelling or redness  Ultrasound examination Left Achilles Tendon  Left Achilles tendon shows increased Doppler flow with a number of neo-vessels The partial tear at the insertion to the calcaneus is smaller Tendon thickness is 0.86 compared to 0.99 at initiation of therapy Less hypoechoic change in the retrocalcaneal bursa On transverse scan there is hypoechoic splitting of the distal tendon  Impression chronic Achilles tendinopathy with a resolving partial tear but with a lot of pathological change still present  Ultrasound and interpretation by Peterson Ao B. Oneida Alar, MD

## 2018-05-14 NOTE — Assessment & Plan Note (Signed)
Slow progress Modify HEP Cont NTG but at 1/2 patch qd Compression ankle sleeve Heel lift Ice pressure massage  Probably just requires more time as this was severe  Reck in 3 months Consider PT and orthotics if not resolving

## 2018-06-04 ENCOUNTER — Other Ambulatory Visit: Payer: Self-pay | Admitting: Podiatry

## 2018-06-04 DIAGNOSIS — M7662 Achilles tendinitis, left leg: Secondary | ICD-10-CM | POA: Diagnosis not present

## 2018-06-04 DIAGNOSIS — M79672 Pain in left foot: Secondary | ICD-10-CM | POA: Diagnosis not present

## 2018-06-04 DIAGNOSIS — D487 Neoplasm of uncertain behavior of other specified sites: Secondary | ICD-10-CM

## 2018-06-05 ENCOUNTER — Ambulatory Visit
Admission: RE | Admit: 2018-06-05 | Discharge: 2018-06-05 | Disposition: A | Discharge status: Home / Self Care | Payer: BLUE CROSS/BLUE SHIELD | Source: Ambulatory Visit | Attending: Podiatry | Admitting: Podiatry

## 2018-06-05 DIAGNOSIS — Z800 Family history of malignant neoplasm of digestive organs: Secondary | ICD-10-CM | POA: Diagnosis not present

## 2018-06-05 DIAGNOSIS — D487 Neoplasm of uncertain behavior of other specified sites: Secondary | ICD-10-CM | POA: Diagnosis not present

## 2018-06-05 DIAGNOSIS — D4989 Neoplasm of unspecified behavior of other specified sites: Secondary | ICD-10-CM | POA: Diagnosis not present

## 2018-06-05 DIAGNOSIS — Z86010 Personal history of colonic polyps: Secondary | ICD-10-CM | POA: Diagnosis not present

## 2018-06-05 LAB — POCT I-STAT CREATININE: Creatinine, Ser: 1.1 mg/dL (ref 0.61–1.24)

## 2018-06-05 MED ORDER — GADOBUTROL 1 MMOL/ML IV SOLN
7.5000 mL | Freq: Once | INTRAVENOUS | Status: AC | PRN
  Administered 2018-06-05: 7.5 mL via INTRAVENOUS

## 2018-06-11 DIAGNOSIS — D481 Neoplasm of uncertain behavior of connective and other soft tissue: Secondary | ICD-10-CM | POA: Diagnosis not present

## 2018-06-13 DIAGNOSIS — X32XXXA Exposure to sunlight, initial encounter: Secondary | ICD-10-CM | POA: Diagnosis not present

## 2018-06-13 DIAGNOSIS — Z85820 Personal history of malignant melanoma of skin: Secondary | ICD-10-CM | POA: Diagnosis not present

## 2018-06-13 DIAGNOSIS — D485 Neoplasm of uncertain behavior of skin: Secondary | ICD-10-CM | POA: Diagnosis not present

## 2018-06-13 DIAGNOSIS — C4442 Squamous cell carcinoma of skin of scalp and neck: Secondary | ICD-10-CM | POA: Diagnosis not present

## 2018-06-13 DIAGNOSIS — D0462 Carcinoma in situ of skin of left upper limb, including shoulder: Secondary | ICD-10-CM | POA: Diagnosis not present

## 2018-06-13 DIAGNOSIS — L570 Actinic keratosis: Secondary | ICD-10-CM | POA: Diagnosis not present

## 2018-06-13 DIAGNOSIS — D2262 Melanocytic nevi of left upper limb, including shoulder: Secondary | ICD-10-CM | POA: Diagnosis not present

## 2018-06-13 DIAGNOSIS — D2272 Melanocytic nevi of left lower limb, including hip: Secondary | ICD-10-CM | POA: Diagnosis not present

## 2018-06-13 DIAGNOSIS — Z85828 Personal history of other malignant neoplasm of skin: Secondary | ICD-10-CM | POA: Diagnosis not present

## 2018-06-15 ENCOUNTER — Other Ambulatory Visit: Payer: Self-pay | Admitting: Family Medicine

## 2018-06-19 DIAGNOSIS — R2242 Localized swelling, mass and lump, left lower limb: Secondary | ICD-10-CM | POA: Diagnosis not present

## 2018-06-21 DIAGNOSIS — C799 Secondary malignant neoplasm of unspecified site: Secondary | ICD-10-CM | POA: Diagnosis not present

## 2018-06-21 DIAGNOSIS — C4032 Malignant neoplasm of short bones of left lower limb: Secondary | ICD-10-CM | POA: Diagnosis not present

## 2018-06-21 DIAGNOSIS — R2242 Localized swelling, mass and lump, left lower limb: Secondary | ICD-10-CM | POA: Diagnosis not present

## 2018-07-03 DIAGNOSIS — C4A72 Merkel cell carcinoma of left lower limb, including hip: Secondary | ICD-10-CM | POA: Diagnosis not present

## 2018-07-03 DIAGNOSIS — R9389 Abnormal findings on diagnostic imaging of other specified body structures: Secondary | ICD-10-CM | POA: Diagnosis not present

## 2018-07-03 DIAGNOSIS — E279 Disorder of adrenal gland, unspecified: Secondary | ICD-10-CM | POA: Diagnosis not present

## 2018-07-03 DIAGNOSIS — C799 Secondary malignant neoplasm of unspecified site: Secondary | ICD-10-CM | POA: Diagnosis not present

## 2018-07-03 DIAGNOSIS — R918 Other nonspecific abnormal finding of lung field: Secondary | ICD-10-CM | POA: Diagnosis not present

## 2018-07-04 DIAGNOSIS — M25572 Pain in left ankle and joints of left foot: Secondary | ICD-10-CM | POA: Diagnosis not present

## 2018-07-04 DIAGNOSIS — G893 Neoplasm related pain (acute) (chronic): Secondary | ICD-10-CM | POA: Diagnosis not present

## 2018-07-04 DIAGNOSIS — M7660 Achilles tendinitis, unspecified leg: Secondary | ICD-10-CM | POA: Diagnosis not present

## 2018-07-04 DIAGNOSIS — C7802 Secondary malignant neoplasm of left lung: Secondary | ICD-10-CM | POA: Diagnosis not present

## 2018-07-04 DIAGNOSIS — R599 Enlarged lymph nodes, unspecified: Secondary | ICD-10-CM | POA: Diagnosis not present

## 2018-07-04 DIAGNOSIS — Z6822 Body mass index (BMI) 22.0-22.9, adult: Secondary | ICD-10-CM | POA: Diagnosis not present

## 2018-07-04 DIAGNOSIS — C4321 Malignant melanoma of right ear and external auricular canal: Secondary | ICD-10-CM | POA: Diagnosis not present

## 2018-07-04 DIAGNOSIS — Z800 Family history of malignant neoplasm of digestive organs: Secondary | ICD-10-CM | POA: Diagnosis not present

## 2018-07-04 DIAGNOSIS — C7951 Secondary malignant neoplasm of bone: Secondary | ICD-10-CM | POA: Diagnosis not present

## 2018-07-04 DIAGNOSIS — E279 Disorder of adrenal gland, unspecified: Secondary | ICD-10-CM | POA: Diagnosis not present

## 2018-07-09 DIAGNOSIS — C4372 Malignant melanoma of left lower limb, including hip: Secondary | ICD-10-CM | POA: Diagnosis not present

## 2018-07-09 DIAGNOSIS — Z1159 Encounter for screening for other viral diseases: Secondary | ICD-10-CM | POA: Diagnosis not present

## 2018-07-09 DIAGNOSIS — C439 Malignant melanoma of skin, unspecified: Secondary | ICD-10-CM | POA: Diagnosis not present

## 2018-07-09 DIAGNOSIS — R911 Solitary pulmonary nodule: Secondary | ICD-10-CM | POA: Diagnosis not present

## 2018-07-09 DIAGNOSIS — Z79899 Other long term (current) drug therapy: Secondary | ICD-10-CM | POA: Diagnosis not present

## 2018-07-09 DIAGNOSIS — C4A30 Merkel cell carcinoma of unspecified part of face: Secondary | ICD-10-CM | POA: Diagnosis not present

## 2018-07-09 DIAGNOSIS — C7951 Secondary malignant neoplasm of bone: Secondary | ICD-10-CM | POA: Diagnosis not present

## 2018-07-09 DIAGNOSIS — Z9221 Personal history of antineoplastic chemotherapy: Secondary | ICD-10-CM | POA: Diagnosis not present

## 2018-07-09 DIAGNOSIS — C4321 Malignant melanoma of right ear and external auricular canal: Secondary | ICD-10-CM | POA: Diagnosis not present

## 2018-07-10 DIAGNOSIS — C4442 Squamous cell carcinoma of skin of scalp and neck: Secondary | ICD-10-CM | POA: Diagnosis not present

## 2018-07-16 DIAGNOSIS — C439 Malignant melanoma of skin, unspecified: Secondary | ICD-10-CM | POA: Diagnosis not present

## 2018-07-16 DIAGNOSIS — Z79899 Other long term (current) drug therapy: Secondary | ICD-10-CM | POA: Diagnosis not present

## 2018-07-16 DIAGNOSIS — C7802 Secondary malignant neoplasm of left lung: Secondary | ICD-10-CM | POA: Diagnosis not present

## 2018-07-16 DIAGNOSIS — R918 Other nonspecific abnormal finding of lung field: Secondary | ICD-10-CM | POA: Diagnosis not present

## 2018-07-16 DIAGNOSIS — R911 Solitary pulmonary nodule: Secondary | ICD-10-CM | POA: Diagnosis not present

## 2018-07-16 DIAGNOSIS — Z48813 Encounter for surgical aftercare following surgery on the respiratory system: Secondary | ICD-10-CM | POA: Diagnosis not present

## 2018-07-22 DIAGNOSIS — D0462 Carcinoma in situ of skin of left upper limb, including shoulder: Secondary | ICD-10-CM | POA: Diagnosis not present

## 2018-07-23 DIAGNOSIS — Z006 Encounter for examination for normal comparison and control in clinical research program: Secondary | ICD-10-CM | POA: Diagnosis not present

## 2018-07-23 DIAGNOSIS — C792 Secondary malignant neoplasm of skin: Secondary | ICD-10-CM | POA: Diagnosis not present

## 2018-07-23 DIAGNOSIS — Z800 Family history of malignant neoplasm of digestive organs: Secondary | ICD-10-CM | POA: Diagnosis not present

## 2018-07-23 DIAGNOSIS — C4321 Malignant melanoma of right ear and external auricular canal: Secondary | ICD-10-CM | POA: Diagnosis not present

## 2018-07-23 DIAGNOSIS — L905 Scar conditions and fibrosis of skin: Secondary | ICD-10-CM | POA: Diagnosis not present

## 2018-07-23 DIAGNOSIS — C439 Malignant melanoma of skin, unspecified: Secondary | ICD-10-CM | POA: Diagnosis not present

## 2018-07-23 DIAGNOSIS — C7951 Secondary malignant neoplasm of bone: Secondary | ICD-10-CM | POA: Diagnosis not present

## 2018-07-23 DIAGNOSIS — C7800 Secondary malignant neoplasm of unspecified lung: Secondary | ICD-10-CM | POA: Diagnosis not present

## 2018-07-23 DIAGNOSIS — Z9221 Personal history of antineoplastic chemotherapy: Secondary | ICD-10-CM | POA: Diagnosis not present

## 2018-07-23 DIAGNOSIS — Z639 Problem related to primary support group, unspecified: Secondary | ICD-10-CM | POA: Diagnosis not present

## 2018-07-23 DIAGNOSIS — Z1509 Genetic susceptibility to other malignant neoplasm: Secondary | ICD-10-CM | POA: Diagnosis not present

## 2018-07-23 DIAGNOSIS — G893 Neoplasm related pain (acute) (chronic): Secondary | ICD-10-CM | POA: Diagnosis not present

## 2018-07-26 DIAGNOSIS — C7802 Secondary malignant neoplasm of left lung: Secondary | ICD-10-CM | POA: Diagnosis not present

## 2018-07-26 DIAGNOSIS — Z85820 Personal history of malignant melanoma of skin: Secondary | ICD-10-CM | POA: Diagnosis not present

## 2018-07-26 DIAGNOSIS — M899 Disorder of bone, unspecified: Secondary | ICD-10-CM | POA: Diagnosis not present

## 2018-07-26 DIAGNOSIS — R590 Localized enlarged lymph nodes: Secondary | ICD-10-CM | POA: Diagnosis not present

## 2018-07-26 DIAGNOSIS — C439 Malignant melanoma of skin, unspecified: Secondary | ICD-10-CM | POA: Diagnosis not present

## 2018-07-26 DIAGNOSIS — R918 Other nonspecific abnormal finding of lung field: Secondary | ICD-10-CM | POA: Diagnosis not present

## 2018-07-26 DIAGNOSIS — Z006 Encounter for examination for normal comparison and control in clinical research program: Secondary | ICD-10-CM | POA: Diagnosis not present

## 2018-07-26 DIAGNOSIS — R2242 Localized swelling, mass and lump, left lower limb: Secondary | ICD-10-CM | POA: Diagnosis not present

## 2018-07-30 ENCOUNTER — Encounter: Payer: Self-pay | Admitting: Family Medicine

## 2018-07-30 DIAGNOSIS — G893 Neoplasm related pain (acute) (chronic): Secondary | ICD-10-CM | POA: Diagnosis not present

## 2018-07-30 DIAGNOSIS — Z1329 Encounter for screening for other suspected endocrine disorder: Secondary | ICD-10-CM | POA: Diagnosis not present

## 2018-07-30 DIAGNOSIS — Z006 Encounter for examination for normal comparison and control in clinical research program: Secondary | ICD-10-CM | POA: Diagnosis not present

## 2018-07-30 DIAGNOSIS — C4321 Malignant melanoma of right ear and external auricular canal: Secondary | ICD-10-CM | POA: Diagnosis not present

## 2018-07-30 DIAGNOSIS — C7951 Secondary malignant neoplasm of bone: Secondary | ICD-10-CM | POA: Diagnosis not present

## 2018-07-30 DIAGNOSIS — C7800 Secondary malignant neoplasm of unspecified lung: Secondary | ICD-10-CM | POA: Diagnosis not present

## 2018-07-30 DIAGNOSIS — Z79899 Other long term (current) drug therapy: Secondary | ICD-10-CM | POA: Diagnosis not present

## 2018-07-30 DIAGNOSIS — Z5111 Encounter for antineoplastic chemotherapy: Secondary | ICD-10-CM | POA: Diagnosis not present

## 2018-08-06 DIAGNOSIS — C7951 Secondary malignant neoplasm of bone: Secondary | ICD-10-CM | POA: Diagnosis not present

## 2018-08-06 DIAGNOSIS — C7800 Secondary malignant neoplasm of unspecified lung: Secondary | ICD-10-CM | POA: Diagnosis not present

## 2018-08-06 DIAGNOSIS — Z006 Encounter for examination for normal comparison and control in clinical research program: Secondary | ICD-10-CM | POA: Diagnosis not present

## 2018-08-06 DIAGNOSIS — Z79899 Other long term (current) drug therapy: Secondary | ICD-10-CM | POA: Diagnosis not present

## 2018-08-06 DIAGNOSIS — G893 Neoplasm related pain (acute) (chronic): Secondary | ICD-10-CM | POA: Diagnosis not present

## 2018-08-06 DIAGNOSIS — Z1329 Encounter for screening for other suspected endocrine disorder: Secondary | ICD-10-CM | POA: Diagnosis not present

## 2018-08-20 DIAGNOSIS — Z79899 Other long term (current) drug therapy: Secondary | ICD-10-CM | POA: Diagnosis not present

## 2018-08-20 DIAGNOSIS — Z1329 Encounter for screening for other suspected endocrine disorder: Secondary | ICD-10-CM | POA: Diagnosis not present

## 2018-08-20 DIAGNOSIS — Z006 Encounter for examination for normal comparison and control in clinical research program: Secondary | ICD-10-CM | POA: Diagnosis not present

## 2018-08-20 DIAGNOSIS — C7951 Secondary malignant neoplasm of bone: Secondary | ICD-10-CM | POA: Diagnosis not present

## 2018-08-20 DIAGNOSIS — C7800 Secondary malignant neoplasm of unspecified lung: Secondary | ICD-10-CM | POA: Diagnosis not present

## 2018-08-20 DIAGNOSIS — G893 Neoplasm related pain (acute) (chronic): Secondary | ICD-10-CM | POA: Diagnosis not present

## 2018-08-21 DIAGNOSIS — Z1329 Encounter for screening for other suspected endocrine disorder: Secondary | ICD-10-CM | POA: Diagnosis not present

## 2018-08-21 DIAGNOSIS — C7951 Secondary malignant neoplasm of bone: Secondary | ICD-10-CM | POA: Diagnosis not present

## 2018-08-21 DIAGNOSIS — Z5111 Encounter for antineoplastic chemotherapy: Secondary | ICD-10-CM | POA: Diagnosis not present

## 2018-08-21 DIAGNOSIS — Z79899 Other long term (current) drug therapy: Secondary | ICD-10-CM | POA: Diagnosis not present

## 2018-08-21 DIAGNOSIS — C439 Malignant melanoma of skin, unspecified: Secondary | ICD-10-CM | POA: Diagnosis not present

## 2018-08-21 DIAGNOSIS — Z006 Encounter for examination for normal comparison and control in clinical research program: Secondary | ICD-10-CM | POA: Diagnosis not present

## 2018-08-21 DIAGNOSIS — C7800 Secondary malignant neoplasm of unspecified lung: Secondary | ICD-10-CM | POA: Diagnosis not present

## 2018-08-24 ENCOUNTER — Other Ambulatory Visit: Payer: Self-pay | Admitting: Family Medicine

## 2018-09-19 DIAGNOSIS — Z9221 Personal history of antineoplastic chemotherapy: Secondary | ICD-10-CM | POA: Diagnosis not present

## 2018-09-19 DIAGNOSIS — C792 Secondary malignant neoplasm of skin: Secondary | ICD-10-CM | POA: Diagnosis not present

## 2018-09-19 DIAGNOSIS — G893 Neoplasm related pain (acute) (chronic): Secondary | ICD-10-CM | POA: Diagnosis not present

## 2018-09-19 DIAGNOSIS — C439 Malignant melanoma of skin, unspecified: Secondary | ICD-10-CM | POA: Diagnosis not present

## 2018-09-19 DIAGNOSIS — C4321 Malignant melanoma of right ear and external auricular canal: Secondary | ICD-10-CM | POA: Diagnosis not present

## 2018-09-19 DIAGNOSIS — Z800 Family history of malignant neoplasm of digestive organs: Secondary | ICD-10-CM | POA: Diagnosis not present

## 2018-09-19 DIAGNOSIS — C4A30 Merkel cell carcinoma of unspecified part of face: Secondary | ICD-10-CM | POA: Diagnosis not present

## 2018-09-19 DIAGNOSIS — Z006 Encounter for examination for normal comparison and control in clinical research program: Secondary | ICD-10-CM | POA: Diagnosis not present

## 2018-09-19 DIAGNOSIS — Z1329 Encounter for screening for other suspected endocrine disorder: Secondary | ICD-10-CM | POA: Diagnosis not present

## 2018-09-19 DIAGNOSIS — C7951 Secondary malignant neoplasm of bone: Secondary | ICD-10-CM | POA: Diagnosis not present

## 2018-09-19 DIAGNOSIS — Z85821 Personal history of Merkel cell carcinoma: Secondary | ICD-10-CM | POA: Diagnosis not present

## 2018-09-19 DIAGNOSIS — Z79899 Other long term (current) drug therapy: Secondary | ICD-10-CM | POA: Diagnosis not present

## 2018-09-19 DIAGNOSIS — Z5112 Encounter for antineoplastic immunotherapy: Secondary | ICD-10-CM | POA: Diagnosis not present

## 2018-09-19 DIAGNOSIS — C7800 Secondary malignant neoplasm of unspecified lung: Secondary | ICD-10-CM | POA: Diagnosis not present

## 2018-09-19 DIAGNOSIS — R5383 Other fatigue: Secondary | ICD-10-CM | POA: Diagnosis not present

## 2018-10-09 ENCOUNTER — Other Ambulatory Visit: Payer: Self-pay

## 2018-10-09 ENCOUNTER — Encounter: Payer: Self-pay | Admitting: Family Medicine

## 2018-10-09 ENCOUNTER — Ambulatory Visit (INDEPENDENT_AMBULATORY_CARE_PROVIDER_SITE_OTHER): Payer: BLUE CROSS/BLUE SHIELD | Admitting: Family Medicine

## 2018-10-09 VITALS — BP 142/76 | HR 67 | Temp 97.5°F | Resp 18 | Wt 166.6 lb

## 2018-10-09 DIAGNOSIS — C4A9 Merkel cell carcinoma, unspecified: Secondary | ICD-10-CM

## 2018-10-09 DIAGNOSIS — E785 Hyperlipidemia, unspecified: Secondary | ICD-10-CM | POA: Diagnosis not present

## 2018-10-09 DIAGNOSIS — C439 Malignant melanoma of skin, unspecified: Secondary | ICD-10-CM

## 2018-10-09 DIAGNOSIS — C799 Secondary malignant neoplasm of unspecified site: Secondary | ICD-10-CM | POA: Diagnosis not present

## 2018-10-09 DIAGNOSIS — K5790 Diverticulosis of intestine, part unspecified, without perforation or abscess without bleeding: Secondary | ICD-10-CM

## 2018-10-09 DIAGNOSIS — Z0000 Encounter for general adult medical examination without abnormal findings: Secondary | ICD-10-CM | POA: Diagnosis not present

## 2018-10-09 LAB — POCT URINALYSIS DIPSTICK
Bilirubin, UA: NEGATIVE
Blood, UA: NEGATIVE
Glucose, UA: NEGATIVE
Ketones, UA: NEGATIVE
Leukocytes, UA: NEGATIVE
Nitrite, UA: NEGATIVE
Protein, UA: NEGATIVE
Spec Grav, UA: 1.015 (ref 1.010–1.025)
Urobilinogen, UA: 0.2 E.U./dL
pH, UA: 5 (ref 5.0–8.0)

## 2018-10-09 NOTE — Progress Notes (Signed)
Patient: Marc Munoz, Male    DOB: 1946-02-17, 73 y.o.   MRN: [id number] Visit Date: 10/09/2018  Today's Provider: Wilhemena Durie, MD   Chief Complaint  Patient presents with  . Annual Exam   Subjective:     Annual physical exam Marc Munoz is a 73 y.o. male who presents today for health maintenance and complete physical. He feels WELL. He reports exercising daily. He reports he is sleeping WELL. He has been treated for metastatic melanoma to bone and to lungs this year.  See oncology notes.   -----------------------------------------------------------------   Review of Systems  Constitutional: Negative.   HENT: Negative.   Eyes: Negative.   Respiratory: Negative.   Cardiovascular: Negative.   Gastrointestinal: Negative.   Endocrine: Negative.   Genitourinary: Negative.   Musculoskeletal: Negative.   Skin: Negative.   Allergic/Immunologic: Negative.   Neurological: Negative.   Hematological: Negative.   Psychiatric/Behavioral: Negative.   All other systems reviewed and are negative.   Social History      He  reports that he has never smoked. He has never used smokeless tobacco. He reports current alcohol use of about 1.0 standard drinks of alcohol per week. He reports that he does not use drugs.       Social History   Socioeconomic History  . Marital status: Married    Spouse name: Manuela Schwartz  . Number of children: 1  . Years of education: 67  . Highest education level: Not on file  Occupational History    Employer: [employer]  Social Needs  . Financial resource strain: Not on file  . Food insecurity:    Worry: Not on file    Inability: Not on file  . Transportation needs:    Medical: Not on file    Non-medical: Not on file  Tobacco Use  . Smoking status: Never Smoker  . Smokeless tobacco: Never Used  Substance and Sexual Activity  . Alcohol use: Yes    Alcohol/week: 1.0 standard drinks    Types: 1 Cans of beer per week    Comment:  occasional  . Drug use: No  . Sexual activity: Yes  Lifestyle  . Physical activity:    Days per week: Not on file    Minutes per session: Not on file  . Stress: Not on file  Relationships  . Social connections:    Talks on phone: Not on file    Gets together: Not on file    Attends religious service: Not on file    Active member of club or organization: Not on file    Attends meetings of clubs or organizations: Not on file    Relationship status: Not on file  Other Topics Concern  . Not on file  Social History Narrative  . Not on file    Past Medical History:  Diagnosis Date  . Cancer ([place])    melanoma  . Hyperlipemia      Patient Active Problem List   Diagnosis Date Noted  . Left Achilles tendinitis 02/19/2018  . Acute renal failure (ARF) ([place]) 01/29/2017  . ED (erectile dysfunction) 07/14/2015  . DD (diverticular disease) 06/03/2015  . H/O malignant neoplasm of skin 06/03/2015  . HLD (hyperlipidemia) 06/03/2015  . Hernia, inguinal, left 06/03/2015  . Merkel cell cancer ([place]) 06/03/2015  . Arthropathy of temporomandibular joint 06/03/2015  . Avitaminosis D 06/03/2015  . Peripheral vascular disease ([place]) 05/08/2014    Past Surgical History:  Procedure Laterality Date  . COLONOSCOPY N/A 05/03/2015   Procedure: COLONOSCOPY;  Surgeon: Manya Silvas, MD;  Location: [facility] ENDOSCOPY;  Service: Endoscopy;  Laterality: N/A;  . HERNIA REPAIR    . MELANOMA EXCISION      Family History        Family Status  Relation Name Status  . Brother  Deceased  . Mother  Deceased  . Father  Deceased        His family history includes Arthritis in his father; COPD in his father; Colon cancer in his father; Heart disease in his mother; Hypertension in his father and mother; Stroke in his brother.      No Known Allergies   Current Outpatient Medications:  .  denosumab (PROLIA) 60 MG/ML SOSY injection, Inject 60 mg into the skin every 6 (six) months., Disp: , Rfl:  .   nivolumab 480 mg in sodium chloride 0.9 % 100 mL, Inject 480 mg into the vein., Disp: , Rfl:  .  simvastatin (ZOCOR) 40 MG tablet, TAKE ONE TABLET EVERY DAY, Disp: 30 tablet, Rfl: 11 .  tamsulosin (FLOMAX) 0.4 MG CAPS capsule, TAKE 1 CAPSULE(0.4 MG) BY MOUTH DAILY, Disp: 30 capsule, Rfl: 11 .  Vitamin D, Ergocalciferol, (DRISDOL) 1.25 MG (50000 UT) CAPS capsule, TAKE 1 CAPSULE EVERY WEEK, Disp: 4 capsule, Rfl: 12 .  aspirin EC 81 MG tablet, Take 81 mg by mouth daily., Disp: , Rfl:  .  Multiple Vitamins tablet, Take 1 tablet by mouth daily. , Disp: , Rfl:  .  nitroGLYCERIN (NITRODUR - DOSED IN MG/24 HR) 0.2 mg/hr patch, Use 1/4 patch daily to the affected area (Patient not taking: Reported on 10/09/2018), Disp: 30 patch, Rfl: 0 .  Nutritional Supplements (IMMUNE ENHANCE) TABS, Take 1 tablet by mouth daily. , Disp: , Rfl:    Patient Care Team: Jerrol Banana., MD as PCP - General (Family Medicine)    Objective:    Vitals: BP (!) 142/76 (BP Location: Right Arm, Patient Position: Sitting, Cuff Size: Normal)   Pulse 67   Temp (!) 97.5 F (36.4 C) (Oral)   Resp 18   Wt 166 lb 9.6 oz (75.6 kg)   SpO2 98%   BMI 22.60 kg/m    Vitals:   10/09/18 0836  BP: (!) 142/76  Pulse: 67  Resp: 18  Temp: (!) 97.5 F (36.4 C)  TempSrc: Oral  SpO2: 98%  Weight: 166 lb 9.6 oz (75.6 kg)     Physical Exam Vitals signs reviewed.  Constitutional:      Appearance: Normal appearance. He is well-developed.  HENT:     Head: Normocephalic and atraumatic.     Right Ear: Tympanic membrane and external ear normal.     Left Ear: Tympanic membrane and external ear normal.     Nose: Nose normal.     Mouth/Throat:     Pharynx: Oropharynx is clear.  Eyes:     General: No scleral icterus.    Conjunctiva/sclera: Conjunctivae normal.     Pupils: Pupils are equal, round, and reactive to light.  Neck:     Thyroid: No thyromegaly.  Cardiovascular:     Rate and Rhythm: Normal rate and regular rhythm.      Heart sounds: Normal heart sounds.  Pulmonary:     Effort: Pulmonary effort is normal.     Breath sounds: Normal breath sounds.  Abdominal:     Palpations: Abdomen is soft.  Genitourinary:    Penis: Normal.  Prostate: Normal.     Rectum: Normal.  Musculoskeletal: Normal range of motion.  Lymphadenopathy:     Cervical: No cervical adenopathy.  Skin:    General: Skin is warm and dry.     Comments: Fair skin.  Neurological:     General: No focal deficit present.     Mental Status: He is alert and oriented to person, place, and time. Mental status is at baseline.  Psychiatric:        Mood and Affect: Mood normal.        Behavior: Behavior normal.        Thought Content: Thought content normal.        Judgment: Judgment normal.      Depression Screen PHQ 2/9 Scores 10/09/2018 07/25/2017 07/19/2016 07/14/2015  PHQ - 2 Score 0 0 0 0  PHQ- 9 Score - 0 - -       Assessment & Plan:     Routine Health Maintenance and Physical Exam  Exercise Activities and Dietary recommendations Goals   None     Immunization History  Administered Date(s) Administered  . Influenza, High Dose Seasonal PF 03/11/2016  . Pneumococcal Conjugate-13 07/08/2014  . Pneumococcal Polysaccharide-23 06/11/2012  . Tdap 06/11/2012  . Zoster 06/11/2012    Health Maintenance  Topic Date Due  . INFLUENZA VACCINE  12/28/2018  . TETANUS/TDAP  06/11/2022  . COLONOSCOPY  05/02/2025  . Hepatitis C Screening  Completed  . PNA vac Low Risk Adult  Completed     Discussed health benefits of physical activity, and encouraged him to engage in regular exercise appropriate for his age and condition.  1. Encounter for annual physical exam   2. Metastatic melanoma [facility]) Being actively treated by oncology.  Metastatic to the heel and to the lung. - Comp. Metabolic Panel (12) - TSH - POCT urinalysis dipstick  3. Hyperlipidemia, unspecified hyperlipidemia type On Zocor. - Lipid Profile  4. Merkel  cell cancer ([place])   5. DD (diverticular disease)      --------------------------------------------------------------------    Wilhemena Durie, MD  [facility]

## 2018-10-15 DIAGNOSIS — Z9221 Personal history of antineoplastic chemotherapy: Secondary | ICD-10-CM | POA: Diagnosis not present

## 2018-10-15 DIAGNOSIS — Z79899 Other long term (current) drug therapy: Secondary | ICD-10-CM | POA: Diagnosis not present

## 2018-10-15 DIAGNOSIS — C7951 Secondary malignant neoplasm of bone: Secondary | ICD-10-CM | POA: Diagnosis not present

## 2018-10-15 DIAGNOSIS — C439 Malignant melanoma of skin, unspecified: Secondary | ICD-10-CM | POA: Diagnosis not present

## 2018-10-15 DIAGNOSIS — R5383 Other fatigue: Secondary | ICD-10-CM | POA: Diagnosis not present

## 2018-10-15 DIAGNOSIS — G893 Neoplasm related pain (acute) (chronic): Secondary | ICD-10-CM | POA: Diagnosis not present

## 2018-10-15 DIAGNOSIS — Z800 Family history of malignant neoplasm of digestive organs: Secondary | ICD-10-CM | POA: Diagnosis not present

## 2018-10-15 DIAGNOSIS — Z006 Encounter for examination for normal comparison and control in clinical research program: Secondary | ICD-10-CM | POA: Diagnosis not present

## 2018-10-15 DIAGNOSIS — C4A30 Merkel cell carcinoma of unspecified part of face: Secondary | ICD-10-CM | POA: Diagnosis not present

## 2018-10-15 DIAGNOSIS — C7800 Secondary malignant neoplasm of unspecified lung: Secondary | ICD-10-CM | POA: Diagnosis not present

## 2018-10-23 DIAGNOSIS — Z85820 Personal history of malignant melanoma of skin: Secondary | ICD-10-CM | POA: Diagnosis not present

## 2018-10-23 DIAGNOSIS — Z85828 Personal history of other malignant neoplasm of skin: Secondary | ICD-10-CM | POA: Diagnosis not present

## 2018-10-23 DIAGNOSIS — L570 Actinic keratosis: Secondary | ICD-10-CM | POA: Diagnosis not present

## 2018-10-23 DIAGNOSIS — D2262 Melanocytic nevi of left upper limb, including shoulder: Secondary | ICD-10-CM | POA: Diagnosis not present

## 2018-10-23 DIAGNOSIS — D2272 Melanocytic nevi of left lower limb, including hip: Secondary | ICD-10-CM | POA: Diagnosis not present

## 2018-10-31 DIAGNOSIS — H2513 Age-related nuclear cataract, bilateral: Secondary | ICD-10-CM | POA: Diagnosis not present

## 2018-11-07 ENCOUNTER — Other Ambulatory Visit: Payer: Self-pay

## 2018-11-08 LAB — CMP12+LP+TP+TSH+6AC+PSA+CBC…
ALT: 9 IU/L (ref 0–44)
AST: 15 IU/L (ref 0–40)
Albumin/Globulin Ratio: 1.8 (ref 1.2–2.2)
Albumin: 4.7 g/dL (ref 3.7–4.7)
Alkaline Phosphatase: 54 IU/L (ref 39–117)
BUN/Creatinine Ratio: 12 (ref 10–24)
BUN: 13 mg/dL (ref 8–27)
Bilirubin Total: 0.4 mg/dL (ref 0.0–1.2)
Calcium: 9.6 mg/dL (ref 8.6–10.2)
Chloride: 99 mmol/L (ref 96–106)
Chol/HDL Ratio: 3.2 ratio (ref 0.0–5.0)
Creatinine, Ser: 1.1 mg/dL (ref 0.76–1.27)
EOS (ABSOLUTE): 0.2 10*3/uL (ref 0.0–0.4)
Estimated CHD Risk: <0.5 times avg. (ref 0.0–1.0)
Free Thyroxine Index: 2.2 (ref 1.2–4.9)
GFR calc Af Amer: 77 mL/min/{1.73_m2} (ref >59)
GFR calc non Af Amer: 67 mL/min/{1.73_m2} (ref >59)
GGT: 9 IU/L (ref 0–65)
Globulin, Total: 2.6 g/dL (ref 1.5–4.5)
Glucose: 87 mg/dL (ref 65–99)
HDL: 64 mg/dL (ref >39)
Hematocrit: 46.8 % (ref 37.5–51.0)
Immature Granulocytes: 0 %
Iron: 80 ug/dL (ref 38–169)
LDH: 164 IU/L (ref 121–224)
LDL Calculated: 108 mg/dL — ABNORMAL HIGH (ref 0–99)
Lymphocytes Absolute: 1.2 10*3/uL (ref 0.7–3.1)
Lymphs: 23 %
MCH: 30.2 pg (ref 26.6–33.0)
MCHC: 34.2 g/dL (ref 31.5–35.7)
MCV: 88 fL (ref 79–97)
Monocytes Absolute: 0.5 10*3/uL (ref 0.1–0.9)
Monocytes: 9 %
Neutrophils Absolute: 3.4 10*3/uL (ref 1.4–7.0)
Neutrophils: 64 %
Phosphorus: 3.1 mg/dL (ref 2.8–4.1)
Platelets: 248 10*3/uL (ref 150–450)
Potassium: 4.3 mmol/L (ref 3.5–5.2)
Prostate Specific Ag, Serum: 3.8 ng/mL (ref 0.0–4.0)
RBC: 5.3 x10E6/uL (ref 4.14–5.80)
RDW: 12.8 % (ref 11.6–15.4)
Sodium: 139 mmol/L (ref 134–144)
T3 Uptake Ratio: 26 % (ref 24–39)
T4, Total: 8.3 ug/dL (ref 4.5–12.0)
TSH: 2.35 u[IU]/mL (ref 0.450–4.500)
Total Protein: 7.3 g/dL (ref 6.0–8.5)
Triglycerides: 163 mg/dL — ABNORMAL HIGH (ref 0–149)
Uric Acid: 5.3 mg/dL (ref 3.7–8.6)
VLDL Cholesterol Cal: 33 mg/dL (ref 5–40)
WBC: 5.4 10*3/uL (ref 3.4–10.8)

## 2018-11-08 LAB — CMP12+LP+TP+TSH+6AC+PSA+CBC?
Basophils Absolute: 0.1 10*3/uL (ref 0.0–0.2)
Basos: 1 %
Cholesterol, Total: 205 mg/dL — ABNORMAL HIGH (ref 100–199)
Eos: 3 %
Hemoglobin: 16 g/dL (ref 13.0–17.7)
Immature Grans (Abs): 0 10*3/uL (ref 0.0–0.1)

## 2018-11-11 DIAGNOSIS — Z006 Encounter for examination for normal comparison and control in clinical research program: Secondary | ICD-10-CM | POA: Diagnosis not present

## 2018-11-11 DIAGNOSIS — M898X7 Other specified disorders of bone, ankle and foot: Secondary | ICD-10-CM | POA: Diagnosis not present

## 2018-11-11 DIAGNOSIS — M898X8 Other specified disorders of bone, other site: Secondary | ICD-10-CM | POA: Diagnosis not present

## 2018-11-11 DIAGNOSIS — C439 Malignant melanoma of skin, unspecified: Secondary | ICD-10-CM | POA: Diagnosis not present

## 2018-11-11 DIAGNOSIS — R590 Localized enlarged lymph nodes: Secondary | ICD-10-CM | POA: Diagnosis not present

## 2018-11-11 DIAGNOSIS — C799 Secondary malignant neoplasm of unspecified site: Secondary | ICD-10-CM | POA: Diagnosis not present

## 2018-11-11 DIAGNOSIS — R9389 Abnormal findings on diagnostic imaging of other specified body structures: Secondary | ICD-10-CM | POA: Diagnosis not present

## 2018-11-11 DIAGNOSIS — R918 Other nonspecific abnormal finding of lung field: Secondary | ICD-10-CM | POA: Diagnosis not present

## 2018-11-11 DIAGNOSIS — R911 Solitary pulmonary nodule: Secondary | ICD-10-CM | POA: Diagnosis not present

## 2018-11-12 DIAGNOSIS — C7800 Secondary malignant neoplasm of unspecified lung: Secondary | ICD-10-CM | POA: Diagnosis not present

## 2018-11-12 DIAGNOSIS — Z9221 Personal history of antineoplastic chemotherapy: Secondary | ICD-10-CM | POA: Diagnosis not present

## 2018-11-12 DIAGNOSIS — C7951 Secondary malignant neoplasm of bone: Secondary | ICD-10-CM | POA: Diagnosis not present

## 2018-11-12 DIAGNOSIS — R21 Rash and other nonspecific skin eruption: Secondary | ICD-10-CM | POA: Diagnosis not present

## 2018-11-12 DIAGNOSIS — C4A30 Merkel cell carcinoma of unspecified part of face: Secondary | ICD-10-CM | POA: Diagnosis not present

## 2018-11-12 DIAGNOSIS — Z006 Encounter for examination for normal comparison and control in clinical research program: Secondary | ICD-10-CM | POA: Diagnosis not present

## 2018-11-12 DIAGNOSIS — Z79899 Other long term (current) drug therapy: Secondary | ICD-10-CM | POA: Diagnosis not present

## 2018-11-12 DIAGNOSIS — C439 Malignant melanoma of skin, unspecified: Secondary | ICD-10-CM | POA: Diagnosis not present

## 2018-11-13 DIAGNOSIS — C439 Malignant melanoma of skin, unspecified: Secondary | ICD-10-CM | POA: Diagnosis not present

## 2018-12-10 DIAGNOSIS — C4321 Malignant melanoma of right ear and external auricular canal: Secondary | ICD-10-CM | POA: Diagnosis not present

## 2018-12-10 DIAGNOSIS — C7951 Secondary malignant neoplasm of bone: Secondary | ICD-10-CM | POA: Diagnosis not present

## 2018-12-10 DIAGNOSIS — Z5112 Encounter for antineoplastic immunotherapy: Secondary | ICD-10-CM | POA: Diagnosis not present

## 2018-12-10 DIAGNOSIS — Z006 Encounter for examination for normal comparison and control in clinical research program: Secondary | ICD-10-CM | POA: Diagnosis not present

## 2018-12-10 DIAGNOSIS — Z79899 Other long term (current) drug therapy: Secondary | ICD-10-CM | POA: Diagnosis not present

## 2018-12-10 DIAGNOSIS — C7800 Secondary malignant neoplasm of unspecified lung: Secondary | ICD-10-CM | POA: Diagnosis not present

## 2018-12-10 DIAGNOSIS — Z6822 Body mass index (BMI) 22.0-22.9, adult: Secondary | ICD-10-CM | POA: Diagnosis not present

## 2018-12-10 DIAGNOSIS — Z800 Family history of malignant neoplasm of digestive organs: Secondary | ICD-10-CM | POA: Diagnosis not present

## 2018-12-10 DIAGNOSIS — M7660 Achilles tendinitis, unspecified leg: Secondary | ICD-10-CM | POA: Diagnosis not present

## 2018-12-10 DIAGNOSIS — Z1329 Encounter for screening for other suspected endocrine disorder: Secondary | ICD-10-CM | POA: Diagnosis not present

## 2019-01-07 DIAGNOSIS — C7951 Secondary malignant neoplasm of bone: Secondary | ICD-10-CM | POA: Diagnosis not present

## 2019-01-07 DIAGNOSIS — C7800 Secondary malignant neoplasm of unspecified lung: Secondary | ICD-10-CM | POA: Diagnosis not present

## 2019-01-07 DIAGNOSIS — Z006 Encounter for examination for normal comparison and control in clinical research program: Secondary | ICD-10-CM | POA: Diagnosis not present

## 2019-01-07 DIAGNOSIS — C439 Malignant melanoma of skin, unspecified: Secondary | ICD-10-CM | POA: Diagnosis not present

## 2019-01-07 DIAGNOSIS — Z6822 Body mass index (BMI) 22.0-22.9, adult: Secondary | ICD-10-CM | POA: Diagnosis not present

## 2019-01-07 DIAGNOSIS — Z1329 Encounter for screening for other suspected endocrine disorder: Secondary | ICD-10-CM | POA: Diagnosis not present

## 2019-01-07 DIAGNOSIS — Z79899 Other long term (current) drug therapy: Secondary | ICD-10-CM | POA: Diagnosis not present

## 2019-01-07 DIAGNOSIS — Z9221 Personal history of antineoplastic chemotherapy: Secondary | ICD-10-CM | POA: Diagnosis not present

## 2019-01-28 ENCOUNTER — Telehealth: Payer: Self-pay | Admitting: Family Medicine

## 2019-01-28 NOTE — Telephone Encounter (Unsigned)
Pt's wife calling about pt's lab work that was done in June.  [place] lab where his labs were done.  Was told lab results are in [place].   Please check and see if lab work is there and let pt know asap.   Also asking for the lab work to be printed and they will come by office and pick it up.  Pt keeps being told we do not have them.  They should be in Epic.  Thanks, [employer]

## 2019-01-28 NOTE — Telephone Encounter (Signed)
Left message regarding lab work was ready to be picked up.

## 2019-01-31 DIAGNOSIS — R932 Abnormal findings on diagnostic imaging of liver and biliary tract: Secondary | ICD-10-CM | POA: Diagnosis not present

## 2019-01-31 DIAGNOSIS — M898X6 Other specified disorders of bone, lower leg: Secondary | ICD-10-CM | POA: Diagnosis not present

## 2019-01-31 DIAGNOSIS — C439 Malignant melanoma of skin, unspecified: Secondary | ICD-10-CM | POA: Diagnosis not present

## 2019-01-31 DIAGNOSIS — Z006 Encounter for examination for normal comparison and control in clinical research program: Secondary | ICD-10-CM | POA: Diagnosis not present

## 2019-01-31 DIAGNOSIS — M898X7 Other specified disorders of bone, ankle and foot: Secondary | ICD-10-CM | POA: Diagnosis not present

## 2019-01-31 DIAGNOSIS — R918 Other nonspecific abnormal finding of lung field: Secondary | ICD-10-CM | POA: Diagnosis not present

## 2019-01-31 DIAGNOSIS — Z85820 Personal history of malignant melanoma of skin: Secondary | ICD-10-CM | POA: Diagnosis not present

## 2019-02-04 DIAGNOSIS — C7951 Secondary malignant neoplasm of bone: Secondary | ICD-10-CM | POA: Diagnosis not present

## 2019-02-04 DIAGNOSIS — K769 Liver disease, unspecified: Secondary | ICD-10-CM | POA: Diagnosis not present

## 2019-02-04 DIAGNOSIS — C4359 Malignant melanoma of other part of trunk: Secondary | ICD-10-CM | POA: Diagnosis not present

## 2019-02-04 DIAGNOSIS — Z6822 Body mass index (BMI) 22.0-22.9, adult: Secondary | ICD-10-CM | POA: Diagnosis not present

## 2019-02-04 DIAGNOSIS — R42 Dizziness and giddiness: Secondary | ICD-10-CM | POA: Diagnosis not present

## 2019-02-04 DIAGNOSIS — Z5112 Encounter for antineoplastic immunotherapy: Secondary | ICD-10-CM | POA: Diagnosis not present

## 2019-02-04 DIAGNOSIS — C7800 Secondary malignant neoplasm of unspecified lung: Secondary | ICD-10-CM | POA: Diagnosis not present

## 2019-02-04 DIAGNOSIS — Z79899 Other long term (current) drug therapy: Secondary | ICD-10-CM | POA: Diagnosis not present

## 2019-02-04 DIAGNOSIS — R911 Solitary pulmonary nodule: Secondary | ICD-10-CM | POA: Diagnosis not present

## 2019-02-04 DIAGNOSIS — Z006 Encounter for examination for normal comparison and control in clinical research program: Secondary | ICD-10-CM | POA: Diagnosis not present

## 2019-02-04 DIAGNOSIS — Z1329 Encounter for screening for other suspected endocrine disorder: Secondary | ICD-10-CM | POA: Diagnosis not present

## 2019-02-04 DIAGNOSIS — C439 Malignant melanoma of skin, unspecified: Secondary | ICD-10-CM | POA: Diagnosis not present

## 2019-02-07 ENCOUNTER — Other Ambulatory Visit: Payer: Self-pay

## 2019-02-07 ENCOUNTER — Observation Stay
Admission: EM | Admit: 2019-02-07 | Discharge: 2019-02-09 | Disposition: A | Discharge status: Home / Self Care | Payer: 59 | Attending: Internal Medicine | Admitting: Internal Medicine

## 2019-02-07 ENCOUNTER — Emergency Department: Payer: 59

## 2019-02-07 DIAGNOSIS — D2261 Melanocytic nevi of right upper limb, including shoulder: Secondary | ICD-10-CM | POA: Diagnosis not present

## 2019-02-07 DIAGNOSIS — D225 Melanocytic nevi of trunk: Secondary | ICD-10-CM | POA: Diagnosis not present

## 2019-02-07 DIAGNOSIS — R569 Unspecified convulsions: Secondary | ICD-10-CM | POA: Diagnosis not present

## 2019-02-07 DIAGNOSIS — Z03818 Encounter for observation for suspected exposure to other biological agents ruled out: Secondary | ICD-10-CM | POA: Diagnosis not present

## 2019-02-07 DIAGNOSIS — D485 Neoplasm of uncertain behavior of skin: Secondary | ICD-10-CM | POA: Diagnosis not present

## 2019-02-07 DIAGNOSIS — Z7982 Long term (current) use of aspirin: Secondary | ICD-10-CM | POA: Insufficient documentation

## 2019-02-07 DIAGNOSIS — C439 Malignant melanoma of skin, unspecified: Secondary | ICD-10-CM | POA: Diagnosis not present

## 2019-02-07 DIAGNOSIS — Z20828 Contact with and (suspected) exposure to other viral communicable diseases: Secondary | ICD-10-CM | POA: Insufficient documentation

## 2019-02-07 DIAGNOSIS — I639 Cerebral infarction, unspecified: Secondary | ICD-10-CM

## 2019-02-07 DIAGNOSIS — R55 Syncope and collapse: Principal | ICD-10-CM | POA: Insufficient documentation

## 2019-02-07 DIAGNOSIS — L570 Actinic keratosis: Secondary | ICD-10-CM | POA: Diagnosis not present

## 2019-02-07 DIAGNOSIS — E785 Hyperlipidemia, unspecified: Secondary | ICD-10-CM | POA: Insufficient documentation

## 2019-02-07 DIAGNOSIS — I700 Atherosclerosis of aorta: Secondary | ICD-10-CM | POA: Diagnosis not present

## 2019-02-07 DIAGNOSIS — D2271 Melanocytic nevi of right lower limb, including hip: Secondary | ICD-10-CM | POA: Diagnosis not present

## 2019-02-07 DIAGNOSIS — I739 Peripheral vascular disease, unspecified: Secondary | ICD-10-CM | POA: Insufficient documentation

## 2019-02-07 DIAGNOSIS — Z8249 Family history of ischemic heart disease and other diseases of the circulatory system: Secondary | ICD-10-CM | POA: Diagnosis not present

## 2019-02-07 DIAGNOSIS — R9431 Abnormal electrocardiogram [ECG] [EKG]: Secondary | ICD-10-CM | POA: Insufficient documentation

## 2019-02-07 DIAGNOSIS — I6523 Occlusion and stenosis of bilateral carotid arteries: Secondary | ICD-10-CM | POA: Diagnosis not present

## 2019-02-07 DIAGNOSIS — C44329 Squamous cell carcinoma of skin of other parts of face: Secondary | ICD-10-CM | POA: Diagnosis not present

## 2019-02-07 DIAGNOSIS — Z79899 Other long term (current) drug therapy: Secondary | ICD-10-CM | POA: Diagnosis not present

## 2019-02-07 DIAGNOSIS — D2262 Melanocytic nevi of left upper limb, including shoulder: Secondary | ICD-10-CM | POA: Diagnosis not present

## 2019-02-07 LAB — CBC WITH DIFFERENTIAL/PLATELET
Abs Immature Granulocytes: 0.03 10*3/uL (ref 0.00–0.07)
Basophils Absolute: 0 10*3/uL (ref 0.0–0.1)
Basophils Relative: 1 %
Eosinophils Absolute: 0.7 10*3/uL — ABNORMAL HIGH (ref 0.0–0.5)
Eosinophils Relative: 10 %
HCT: 40.8 % (ref 39.0–52.0)
Hemoglobin: 13.3 g/dL (ref 13.0–17.0)
Immature Granulocytes: 0 %
Lymphocytes Relative: 17 %
Lymphs Abs: 1.2 10*3/uL (ref 0.7–4.0)
MCH: 30 pg (ref 26.0–34.0)
MCHC: 32.6 g/dL (ref 30.0–36.0)
MCV: 92.1 fL (ref 80.0–100.0)
Monocytes Absolute: 0.6 10*3/uL (ref 0.1–1.0)
Monocytes Relative: 8 %
Neutro Abs: 4.6 10*3/uL (ref 1.7–7.7)
Neutrophils Relative %: 64 %
Platelets: 211 10*3/uL (ref 150–400)
RBC: 4.43 MIL/uL (ref 4.22–5.81)
RDW: 13.5 % (ref 11.5–15.5)
WBC: 7.1 10*3/uL (ref 4.0–10.5)
nRBC: 0 % (ref 0.0–0.2)

## 2019-02-07 NOTE — ED Triage Notes (Signed)
Patient from home with seizure like activity. Patient arrives AOX4, non postictal. Denies any cp/sob. Reports was was feeling jittery but feels fine now.

## 2019-02-07 NOTE — ED Notes (Signed)
Patient off unit to CT

## 2019-02-07 NOTE — ED Provider Notes (Signed)
[facility] Emergency Department Provider Note    First MD Initiated Contact with Patient 02/07/19 2211     (approximate)  I have reviewed the triage vital signs and the nursing notes.   HISTORY  Chief Complaint Seizures    HPI Marc Munoz is a 73 y.o. male below his past medical history presents the ER after syncopal episode.  This was witnessed by wife reports generalized shaking spell that lasted 1 to 2 minutes.  Patient was unresponsive.  States that after the episode he did not have any postictal period.  Denies any chest pain or shortness of breath.  Is never had symptoms like this before.  He is currently being treated for metastatic melanoma.    Past Medical History:  Diagnosis Date  . Cancer ([place])    melanoma  . Hyperlipemia    Family History  Problem Relation Age of Onset  . Stroke Brother   . Heart disease Mother   . Hypertension Mother   . Hypertension Father   . Arthritis Father   . COPD Father   . Colon cancer Father    Past Surgical History:  Procedure Laterality Date  . COLONOSCOPY N/A 05/03/2015   Procedure: COLONOSCOPY;  Surgeon: Manya Silvas, MD;  Location: [facility] ENDOSCOPY;  Service: Endoscopy;  Laterality: N/A;  . HERNIA REPAIR    . MELANOMA EXCISION     Patient Active Problem List   Diagnosis Date Noted  . Left Achilles tendinitis 02/19/2018  . Acute renal failure (ARF) ([place]) 01/29/2017  . ED (erectile dysfunction) 07/14/2015  . DD (diverticular disease) 06/03/2015  . H/O malignant neoplasm of skin 06/03/2015  . HLD (hyperlipidemia) 06/03/2015  . Hernia, inguinal, left 06/03/2015  . Merkel cell cancer ([place]) 06/03/2015  . Arthropathy of temporomandibular joint 06/03/2015  . Avitaminosis D 06/03/2015  . Peripheral vascular disease ([place]) 05/08/2014      Prior to Admission medications   Medication Sig Start Date End Date Taking? Authorizing Provider  aspirin EC 81 MG tablet Take 81 mg by mouth daily.     [provider]  denosumab (PROLIA) 60 MG/ML SOSY injection Inject 60 mg into the skin every 6 (six) months.    [provider]  Multiple Vitamins tablet Take 1 tablet by mouth daily.  04/10/11   [provider]  nitroGLYCERIN (NITRODUR - DOSED IN MG/24 HR) 0.2 mg/hr patch Use 1/4 patch daily to the affected area Patient not taking: Reported on 10/09/2018 04/29/18   Stefanie Libel, MD  nivolumab 480 mg in sodium chloride 0.9 % 100 mL Inject 480 mg into the vein.    [provider]  Nutritional Supplements (IMMUNE ENHANCE) TABS Take 1 tablet by mouth daily.  04/10/11   [provider]  simvastatin (ZOCOR) 40 MG tablet TAKE ONE TABLET EVERY DAY 08/24/18   Jerrol Banana., MD  tamsulosin [facility]) 0.4 MG CAPS capsule TAKE 1 CAPSULE(0.4 MG) BY MOUTH DAILY 04/29/18   Jerrol Banana., MD  Vitamin D, Ergocalciferol, (DRISDOL) 1.25 MG (50000 UT) CAPS capsule TAKE 1 CAPSULE EVERY WEEK 06/17/18   Jerrol Banana., MD    Allergies Patient has no known allergies.    Social History Social History   Tobacco Use  . Smoking status: Never Smoker  . Smokeless tobacco: Never Used  Substance Use Topics  . Alcohol use: Yes    Alcohol/week: 1.0 standard drinks    Types: 1 Cans of beer per week  Comment: occasional  . Drug use: No    Review of Systems Patient denies headaches, rhinorrhea, blurry vision, numbness, shortness of breath, chest pain, edema, cough, abdominal pain, nausea, vomiting, diarrhea, dysuria, fevers, rashes or hallucinations unless otherwise stated above in HPI. ____________________________________________   PHYSICAL EXAM:  VITAL SIGNS: Vitals:   02/07/19 2211  Pulse: 61  Resp: 16  Temp: 98.7 F (37.1 C)  SpO2: 99%    Constitutional: Alert and oriented.  Eyes: Conjunctivae are normal.  Head: Atraumatic. Nose: No congestion/rhinnorhea. Mouth/Throat: Mucous membranes are moist.   Neck: No stridor. Painless ROM.   Cardiovascular: Normal rate, regular rhythm. Grossly normal heart sounds.  Good peripheral circulation. Respiratory: Normal respiratory effort.  No retractions. Lungs CTAB. Gastrointestinal: Soft and nontender. No distention. No abdominal bruits. No CVA tenderness. Genitourinary:  Musculoskeletal: No lower extremity tenderness nor edema.  No joint effusions. Neurologic:  Normal speech and language. No gross focal neurologic deficits are appreciated. No facial droop Skin:  Skin is warm, dry and intact. No rash noted. Psychiatric: Mood and affect are normal. Speech and behavior are normal.  ____________________________________________   LABS (all labs ordered are listed, but only abnormal results are displayed)  Results for orders placed or performed during the hospital encounter of 02/07/19 (from the past 24 hour(s))  CBC with Differential/Platelet     Status: Abnormal   Collection Time: 02/07/19 10:22 PM  Result Value Ref Range   WBC 7.1 4.0 - 10.5 K/uL   RBC 4.43 4.22 - 5.81 MIL/uL   Hemoglobin 13.3 13.0 - 17.0 g/dL   HCT 40.8 39.0 - 52.0 %   MCV 92.1 80.0 - 100.0 fL   MCH 30.0 26.0 - 34.0 pg   MCHC 32.6 30.0 - 36.0 g/dL   RDW 13.5 11.5 - 15.5 %   Platelets 211 150 - 400 K/uL   nRBC 0.0 0.0 - 0.2 %   Neutrophils Relative % 64 %   Neutro Abs 4.6 1.7 - 7.7 K/uL   Lymphocytes Relative 17 %   Lymphs Abs 1.2 0.7 - 4.0 K/uL   Monocytes Relative 8 %   Monocytes Absolute 0.6 0.1 - 1.0 K/uL   Eosinophils Relative 10 %   Eosinophils Absolute 0.7 (H) 0.0 - 0.5 K/uL   Basophils Relative 1 %   Basophils Absolute 0.0 0.0 - 0.1 K/uL   Immature Granulocytes 0 %   Abs Immature Granulocytes 0.03 0.00 - 0.07 K/uL  Urinalysis, Complete w Microscopic     Status: Abnormal   Collection Time: 02/07/19 11:23 PM  Result Value Ref Range   Color, Urine YELLOW (A) YELLOW   APPearance CLEAR (A) CLEAR   Specific Gravity, Urine 1.009 1.005 - 1.030   pH 6.0 5.0 - 8.0   Glucose, UA NEGATIVE NEGATIVE  mg/dL   Hgb urine dipstick NEGATIVE NEGATIVE   Bilirubin Urine NEGATIVE NEGATIVE   Ketones, ur NEGATIVE NEGATIVE mg/dL   Protein, ur NEGATIVE NEGATIVE mg/dL   Nitrite NEGATIVE NEGATIVE   Leukocytes,Ua NEGATIVE NEGATIVE   WBC, UA 0-5 0 - 5 WBC/hpf   Bacteria, UA NONE SEEN NONE SEEN   Squamous Epithelial / LPF NONE SEEN 0 - 5   Mucus PRESENT    ____________________________________________  EKG My review and personal interpretation at Time: 22:12   Indication: syncope  Rate: 60  Rhythm: sinus Axis: normal Other: inferolateral t wave inversion ____________________________________________  RADIOLOGY  I personally reviewed all radiographic images ordered to evaluate for the above acute complaints and reviewed radiology reports and  findings.  These findings were personally discussed with the patient.  Please see medical record for radiology report.  ____________________________________________   PROCEDURES  Procedure(s) performed:  Procedures    Critical Care performed: no ____________________________________________   INITIAL IMPRESSION / ASSESSMENT AND PLAN / ED COURSE  Pertinent labs & imaging results that were available during my care of the patient were reviewed by me and considered in my medical decision making (see chart for details).   DDX: dysrythmia, seizure, electrolyte abn, mass, bleed, acs  HRIHAAN HULET is a 73 y.o. who presents to the ED with symptoms as described above.  Patient currently well-appearing in no acute distress.  Based on his presentation I do believe he will require hospitalization.  His EKG does show some T wave abnormalities but he is denying any chest pain or shortness of breath at this time.  Patient will be signed out to oncoming physician pending follow-up on labs and mission hospitalist.     The patient was evaluated in Emergency Department today for the symptoms described in the history of present illness. He/she was evaluated in the  context of the global COVID-19 pandemic, which necessitated consideration that the patient might be at risk for infection with the SARS-CoV-2 virus that causes COVID-19. Institutional protocols and algorithms that pertain to the evaluation of patients at risk for COVID-19 are in a state of rapid change based on information released by regulatory bodies including the CDC and federal and state organizations. These policies and algorithms were followed during the patient's care in the ED.  As part of my medical decision making, I reviewed the following data within the [place] notes reviewed and incorporated, Labs reviewed, notes from prior ED visits and [place] Controlled Substance Database   ____________________________________________   FINAL CLINICAL IMPRESSION(S) / ED DIAGNOSES  Final diagnoses:  Syncope, unspecified syncope type      NEW MEDICATIONS STARTED DURING THIS VISIT:  New Prescriptions   No medications on file     Note:  This document was prepared using Dragon voice recognition software and may include unintentional dictation errors.    Merlyn Lot, MD 02/08/19 [phone]

## 2019-02-08 ENCOUNTER — Observation Stay: Payer: 59

## 2019-02-08 DIAGNOSIS — I639 Cerebral infarction, unspecified: Secondary | ICD-10-CM | POA: Diagnosis not present

## 2019-02-08 DIAGNOSIS — R9431 Abnormal electrocardiogram [ECG] [EKG]: Secondary | ICD-10-CM | POA: Diagnosis not present

## 2019-02-08 DIAGNOSIS — R9089 Other abnormal findings on diagnostic imaging of central nervous system: Secondary | ICD-10-CM | POA: Diagnosis not present

## 2019-02-08 DIAGNOSIS — E785 Hyperlipidemia, unspecified: Secondary | ICD-10-CM | POA: Diagnosis not present

## 2019-02-08 DIAGNOSIS — R55 Syncope and collapse: Secondary | ICD-10-CM | POA: Diagnosis present

## 2019-02-08 LAB — URINALYSIS, COMPLETE (UACMP) WITH MICROSCOPIC
Bacteria, UA: NONE SEEN
Bilirubin Urine: NEGATIVE
Glucose, UA: NEGATIVE mg/dL
Hgb urine dipstick: NEGATIVE
Ketones, ur: NEGATIVE mg/dL
Leukocytes,Ua: NEGATIVE
Nitrite: NEGATIVE
Protein, ur: NEGATIVE mg/dL
Specific Gravity, Urine: 1.009 (ref 1.005–1.030)
Squamous Epithelial / HPF: NONE SEEN (ref 0–5)
pH: 6 (ref 5.0–8.0)

## 2019-02-08 LAB — COMPREHENSIVE METABOLIC PANEL
ALT: 15 U/L (ref 0–44)
AST: 21 U/L (ref 15–41)
Albumin: 4.1 g/dL (ref 3.5–5.0)
Alkaline Phosphatase: 37 U/L — ABNORMAL LOW (ref 38–126)
Anion gap: 9 (ref 5–15)
BUN: 21 mg/dL (ref 8–23)
CO2: 28 mmol/L (ref 22–32)
Calcium: 9.5 mg/dL (ref 8.9–10.3)
Chloride: 99 mmol/L (ref 98–111)
Creatinine, Ser: 1.17 mg/dL (ref 0.61–1.24)
GFR calc Af Amer: >60 mL/min (ref >60)
GFR calc non Af Amer: >60 mL/min (ref >60)
Glucose, Bld: 116 mg/dL — ABNORMAL HIGH (ref 70–99)
Potassium: 3.6 mmol/L (ref 3.5–5.1)
Sodium: 136 mmol/L (ref 135–145)
Total Bilirubin: 0.7 mg/dL (ref 0.3–1.2)
Total Protein: 6.9 g/dL (ref 6.5–8.1)

## 2019-02-08 LAB — TSH: TSH: 2.664 u[IU]/mL (ref 0.350–4.500)

## 2019-02-08 LAB — BASIC METABOLIC PANEL
Anion gap: 8 (ref 5–15)
BUN: 19 mg/dL (ref 8–23)
CO2: 24 mmol/L (ref 22–32)
Calcium: 8.7 mg/dL — ABNORMAL LOW (ref 8.9–10.3)
Chloride: 104 mmol/L (ref 98–111)
Creatinine, Ser: 0.95 mg/dL (ref 0.61–1.24)
GFR calc Af Amer: >60 mL/min (ref >60)
GFR calc non Af Amer: >60 mL/min (ref >60)
Glucose, Bld: 121 mg/dL — ABNORMAL HIGH (ref 70–99)
Potassium: 3.7 mmol/L (ref 3.5–5.1)
Sodium: 136 mmol/L (ref 135–145)

## 2019-02-08 LAB — CBC
HCT: 39 % (ref 39.0–52.0)
Hemoglobin: 12.9 g/dL — ABNORMAL LOW (ref 13.0–17.0)
MCH: 30.6 pg (ref 26.0–34.0)
MCHC: 33.1 g/dL (ref 30.0–36.0)
MCV: 92.6 fL (ref 80.0–100.0)
Platelets: 189 10*3/uL (ref 150–400)
RBC: 4.21 MIL/uL — ABNORMAL LOW (ref 4.22–5.81)
RDW: 13.4 % (ref 11.5–15.5)
WBC: 11.1 10*3/uL — ABNORMAL HIGH (ref 4.0–10.5)
nRBC: 0 % (ref 0.0–0.2)

## 2019-02-08 LAB — TROPONIN I (HIGH SENSITIVITY)
Troponin I (High Sensitivity): 10 ng/L (ref <18)
Troponin I (High Sensitivity): 12 ng/L (ref <18)
Troponin I (High Sensitivity): 15 ng/L (ref <18)
Troponin I (High Sensitivity): 17 ng/L (ref <18)

## 2019-02-08 LAB — GLUCOSE, CAPILLARY: Glucose-Capillary: 95 mg/dL (ref 70–99)

## 2019-02-08 LAB — SARS CORONAVIRUS 2 (TAT 6-24 HRS): SARS Coronavirus 2: NEGATIVE

## 2019-02-08 MED ORDER — TRAZODONE HCL 50 MG PO TABS
25.0000 mg | ORAL_TABLET | Freq: Every evening | ORAL | Status: DC | PRN
  Administered 2019-02-08: 25 mg via ORAL
  Filled 2019-02-08: qty 1

## 2019-02-08 MED ORDER — ONDANSETRON HCL 4 MG/2ML IJ SOLN: 4.0000 mg | Freq: Four times a day (QID) | INTRAMUSCULAR | Status: DC | PRN

## 2019-02-08 MED ORDER — DOCUSATE SODIUM 100 MG PO CAPS: 100.0000 mg | ORAL_CAPSULE | Freq: Two times a day (BID) | ORAL | Status: DC

## 2019-02-08 MED ORDER — HYDROCODONE-ACETAMINOPHEN 5-325 MG PO TABS: 1.0000 | ORAL_TABLET | ORAL | Status: DC | PRN

## 2019-02-08 MED ORDER — SIMVASTATIN 20 MG PO TABS
40.0000 mg | ORAL_TABLET | Freq: Every day | ORAL | Status: DC
  Administered 2019-02-08 – 2019-02-09 (×2): 40 mg via ORAL
  Filled 2019-02-08: qty 4
  Filled 2019-02-08: qty 2

## 2019-02-08 MED ORDER — IMMUNE ENHANCE PO TABS: 1.0000 | ORAL_TABLET | Freq: Every day | ORAL | Status: DC

## 2019-02-08 MED ORDER — ACETAMINOPHEN 650 MG RE SUPP: 650.0000 mg | Freq: Four times a day (QID) | RECTAL | Status: DC | PRN

## 2019-02-08 MED ORDER — DOCUSATE SODIUM 100 MG PO CAPS
100.0000 mg | ORAL_CAPSULE | Freq: Two times a day (BID) | ORAL | Status: DC
  Administered 2019-02-08 – 2019-02-09 (×2): 100 mg via ORAL
  Filled 2019-02-08 (×3): qty 1

## 2019-02-08 MED ORDER — BISACODYL 5 MG PO TBEC: 5.0000 mg | DELAYED_RELEASE_TABLET | Freq: Every day | ORAL | Status: DC | PRN

## 2019-02-08 MED ORDER — ACETAMINOPHEN 325 MG PO TABS: 650.0000 mg | ORAL_TABLET | Freq: Four times a day (QID) | ORAL | Status: DC | PRN

## 2019-02-08 MED ORDER — GADOBUTROL 1 MMOL/ML IV SOLN
7.5000 mL | Freq: Once | INTRAVENOUS | Status: AC | PRN
  Administered 2019-02-08: 7.5 mL via INTRAVENOUS

## 2019-02-08 MED ORDER — ASPIRIN EC 81 MG PO TBEC
81.0000 mg | DELAYED_RELEASE_TABLET | Freq: Every day | ORAL | Status: DC
  Administered 2019-02-09: 81 mg via ORAL
  Filled 2019-02-08 (×2): qty 1

## 2019-02-08 MED ORDER — HEPARIN SODIUM (PORCINE) 5000 UNIT/ML IJ SOLN
5000.0000 [IU] | Freq: Three times a day (TID) | INTRAMUSCULAR | Status: DC
  Administered 2019-02-08 – 2019-02-09 (×3): 5000 [IU] via SUBCUTANEOUS
  Filled 2019-02-08 (×3): qty 1

## 2019-02-08 MED ORDER — ONDANSETRON HCL 4 MG PO TABS: 4.0000 mg | ORAL_TABLET | Freq: Four times a day (QID) | ORAL | Status: DC | PRN

## 2019-02-08 MED ORDER — SODIUM CHLORIDE 0.9 % IV SOLN
INTRAVENOUS | Status: DC
  Administered 2019-02-08 – 2019-02-09 (×3): via INTRAVENOUS

## 2019-02-08 NOTE — ED Notes (Signed)
Report to amber, rn.  

## 2019-02-08 NOTE — Progress Notes (Signed)
Patient ID: SARGON MAGGS, male   DOB: 1946/01/02, 73 y.o.   MRN: [id number]  [facility] PROGRESS NOTE  QADREE FLOREY [id number] DOB: 1945/08/21 DOA: 02/07/2019 PCP: Jerrol Banana., MD  HPI/Subjective: Patient had a dermatological procedure yesterday where some spots were taken off his face.  And 9 PM he had a bowel movement which with straining.  He felt some dizziness.  He thought his blood pressure was low.  He was seeing white lights.  He was pale and sweaty.  As per family he went out and then he started shaking all over and his toes curled.  No loss of bowel movement or bladder function.  Was able to talk after the episode.  Objective: Vitals:   02/08/19 1000 02/08/19 1130  BP: 119/66 126/68  Pulse: 63 62  Resp:  18  Temp:    SpO2: 99% 100%    Intake/Output Summary (Last 24 hours) at 02/08/2019 1330 Last data filed at 02/08/2019 0612 Gross per 24 hour  Intake -  Output 800 ml  Net -800 ml   Filed Weights   02/07/19 2212  Weight: 73.9 kg    ROS: Review of Systems  Constitutional: Negative for chills and fever.  Eyes: Negative for blurred vision.  Respiratory: Negative for cough and shortness of breath.   Cardiovascular: Negative for chest pain.  Gastrointestinal: Negative for abdominal pain, constipation, diarrhea, nausea and vomiting.  Genitourinary: Negative for dysuria.  Musculoskeletal: Negative for joint pain.  Neurological: Negative for dizziness and headaches.   Exam: Physical Exam  Constitutional: He is oriented to person, place, and time.  HENT:  Nose: No mucosal edema.  Mouth/Throat: No oropharyngeal exudate or posterior oropharyngeal edema.  Eyes: Pupils are equal, round, and reactive to light. Conjunctivae, EOM and lids are normal.  Neck: No JVD present. Carotid bruit is not present. No edema present. No thyroid mass and no thyromegaly present.  Cardiovascular: S1 normal and S2 normal. Exam reveals no gallop.  No murmur  heard. Pulses:      Dorsalis pedis pulses are 2+ on the right side and 2+ on the left side.  Respiratory: No respiratory distress. He has no wheezes. He has no rhonchi. He has no rales.  GI: Soft. Bowel sounds are normal. There is no abdominal tenderness.  Musculoskeletal:     Right ankle: He exhibits no swelling.     Left ankle: He exhibits no swelling.  Lymphadenopathy:    He has no cervical adenopathy.  Neurological: He is alert and oriented to person, place, and time. No cranial nerve deficit.  Skin: Skin is warm. No rash noted. Nails show no clubbing.  Psychiatric: He has a normal mood and affect.      Data Reviewed: Basic Metabolic Panel: Recent Labs  Lab 02/07/19 2222 02/08/19 0423  NA 136 136  K 3.6 3.7  CL 99 104  CO2 28 24  GLUCOSE 116* 121*  BUN 21 19  CREATININE 1.17 0.95  CALCIUM 9.5 8.7*   Liver Function Tests: Recent Labs  Lab 02/07/19 2222  AST 21  ALT 15  ALKPHOS 37*  BILITOT 0.7  PROT 6.9  ALBUMIN 4.1   CBC: Recent Labs  Lab 02/07/19 2222 02/08/19 0423  WBC 7.1 11.1*  NEUTROABS 4.6  --   HGB 13.3 12.9*  HCT 40.8 39.0  MCV 92.1 92.6  PLT 211 189    CBG: Recent Labs  Lab 02/08/19 1115  GLUCAP 95    Recent Results (from the  past 240 hour(s))  SARS CORONAVIRUS 2 (TAT 6-24 HRS) Nasopharyngeal Nasopharyngeal Swab     Status: None   Collection Time: 02/07/19 11:23 PM   Specimen: Nasopharyngeal Swab  Result Value Ref Range Status   SARS Coronavirus 2 NEGATIVE NEGATIVE Final    Comment: (NOTE) SARS-CoV-2 target nucleic acids are NOT DETECTED. The SARS-CoV-2 RNA is generally detectable in upper and lower respiratory specimens during the acute phase of infection. Negative results do not preclude SARS-CoV-2 infection, do not rule out co-infections with other pathogens, and should not be used as the sole basis for treatment or other patient management decisions. Negative results must be combined with clinical observations, patient  history, and epidemiological information. The expected result is Negative. Fact Sheet for Patients: SugarRoll.be Fact Sheet for Healthcare Providers: https://www.woods-mathews.com/ This test is not yet approved or cleared by the [place] FDA and  has been authorized for detection and/or diagnosis of SARS-CoV-2 by FDA under an Emergency Use Authorization (EUA). This EUA will remain  in effect (meaning this test can be used) for the duration of the COVID-19 declaration under Section 56 4(b)(1) of the Act, 21 U.S.C. section 360bbb-3(b)(1), unless the authorization is terminated or revoked sooner. Performed at [facility], [place] [address], [place], [place] [address]      Studies: Ct Head Wo Contrast  Result Date: 02/07/2019 CLINICAL DATA:  Seizure, new, nontraumatic, >40 yrs EXAM: CT HEAD WITHOUT CONTRAST TECHNIQUE: Contiguous axial images were obtained from the base of the skull through the vertex without intravenous contrast. COMPARISON:  None. FINDINGS: Brain: No intracranial hemorrhage, mass effect, or midline shift. No hydrocephalus. The basilar cisterns are patent. No evidence of territorial infarct or acute ischemia. No extra-axial or intracranial fluid collection. Vascular: No hyperdense vessel or unexpected calcification. Skull: No fracture or focal lesion. Sinuses/Orbits: Mild mucosal thickening of few ethmoid air cells. No sinus fluid levels. Mastoid air cells are clear. Included orbits are unremarkable. Other: None. IMPRESSION: No acute intracranial abnormality or explanation for seizure. Electronically Signed   By: Keith Rake M.D.   On: 02/07/2019 23:51   Mr Jeri Cos [id number] Contrast  Result Date: 02/08/2019 CLINICAL DATA:  Seizure, new, nontraumatic. History of metastatic melanoma admitted with syncope. EXAM: MRI HEAD WITHOUT AND WITH CONTRAST TECHNIQUE: Multiplanar, multiecho pulse sequences of the brain and surrounding  structures were obtained without and with intravenous contrast. CONTRAST:  7.45mL GADAVIST GADOBUTROL 1 MMOL/ML IV SOLN COMPARISON:  Head CT from yesterday FINDINGS: Brain: 3 mm lesion along the right caudate head and adjacent white matter with mild peripheral T1 shortening, no blood products by gradient imaging, and no detectable enhancement. Centrally this has a cystic appearance. No acute infarct, hydrocephalus, or collection. The brain is normal appearing other than the small lesion. No atrophy or white matter disease Vascular: Major flow voids and vascular enhancements are preserved Skull and upper cervical spine: Negative for marrow lesion Sinuses/Orbits: Negative IMPRESSION: 1. 3 mm lesion at the right caudate nucleus which could be chronic lacune or a small metastatic focus. Recommend short follow-up brain MRI in 6-12 weeks. 2. No cortical finding to explain seizure. Electronically Signed   By: Monte Fantasia M.D.   On: 02/08/2019 11:43   Dg Chest Portable 1 View  Result Date: 02/07/2019 CLINICAL DATA:  73 year old male with seizure like activity. Syncope. EXAM: PORTABLE CHEST 1 VIEW COMPARISON:  Chest radiograph dated 04/18/2011 FINDINGS: Focal area of density in the left mid lung field, likely atelectasis or interstitial crowding. No focal  consolidation, pleural effusion, or pneumothorax. The cardiac silhouette is within normal limits. No acute osseous pathology. Atherosclerotic calcification of the aortic arch. IMPRESSION: No active disease. Electronically Signed   By: Anner Crete M.D.   On: 02/07/2019 23:55    Scheduled Meds: . aspirin EC  81 mg Oral Daily  . docusate sodium  100 mg Oral BID  . heparin  5,000 Units Subcutaneous Q8H  . simvastatin  40 mg Oral Daily   Continuous Infusions: . sodium chloride 75 mL/hr at 02/08/19 1120    Assessment/Plan:  1. Vasovagal syncope with convulsion.  Case discussed with neurology and no need for antiseizure medication at this  point. 2. Abnormal MRI of the brain with a 3 mm focus in the right caudate nucleus.  Could be infarct versus early metastases.  Patient on aspirin and simvastatin.  Echocardiogram and carotid ultrasound ordered by neurology.  Will need close follow-up with oncology as outpatient and a repeat MRI brain in 6 to 12 weeks. 3. Hyperlipidemia on Zocor 4. Abnormal EKG.  Seen by cardiology.  Patient on aspirin.  Code Status:     Code Status Orders  (From admission, onward)         Start     Ordered   02/08/19 0206  Full code  Continuous     02/08/19 0205        Code Status History    Date Active Date Inactive Code Status Order ID Comments User Context   01/29/2017 1916 01/30/2017 1157 Full Code [id number]  Epifanio Lesches, MD ED   Advance Care Planning Activity     Family Communication: Spoke with family on the phone Disposition Plan: To be determined  Consultants:  Cardiology  Neurology  Time spent: 40 minutes in speaking with the patient earlier today and this afternoon with follow-up of the MRI.  Sevan Mcbroom [employer]

## 2019-02-08 NOTE — ED Notes (Signed)
Dr Doy Mince Neurologist at bedside

## 2019-02-08 NOTE — ED Notes (Signed)
Patient requesting to be taken off monitor to walk around in room

## 2019-02-08 NOTE — ED Notes (Addendum)
Patient and wife updated on upcoming test and consults.

## 2019-02-08 NOTE — ED Notes (Signed)
Charge RN notified of need for hospital bed.

## 2019-02-08 NOTE — H&P (Signed)
[facility] [place] NAME: Marc Munoz    MR#:  [id number]  DATE OF BIRTH:  01-Dec-1945  DATE OF ADMISSION:  02/07/2019  PRIMARY CARE PHYSICIAN: Jerrol Banana., MD   REQUESTING/REFERRING PHYSICIAN: Merlyn Lot, MD  CHIEF COMPLAINT:   Chief Complaint  Patient presents with  . Seizures    HISTORY OF PRESENT ILLNESS:  Marc Munoz  is a 73 y.o. male with a known history of metastatic melanoma is being admitted for syncopal episode.  This was witnessed by wife reports generalized shaking spell that lasted 1 to 2 minutes.  Patient was unresponsive.  States that after the episode he did not have any postictal period.  Denies any chest pain or shortness of breath.  Is never had symptoms like this before.   PAST MEDICAL HISTORY:   Past Medical History:  Diagnosis Date  . Cancer ([place])    melanoma  . Hyperlipemia     PAST SURGICAL HISTORY:   Past Surgical History:  Procedure Laterality Date  . COLONOSCOPY N/A 05/03/2015   Procedure: COLONOSCOPY;  Surgeon: Manya Silvas, MD;  Location: [facility] ENDOSCOPY;  Service: Endoscopy;  Laterality: N/A;  . HERNIA REPAIR    . MELANOMA EXCISION      SOCIAL HISTORY:   Social History   Tobacco Use  . Smoking status: Never Smoker  . Smokeless tobacco: Never Used  Substance Use Topics  . Alcohol use: Yes    Alcohol/week: 1.0 standard drinks    Types: 1 Cans of beer per week    Comment: occasional    FAMILY HISTORY:   Family History  Problem Relation Age of Onset  . Stroke Brother   . Heart disease Mother   . Hypertension Mother   . Hypertension Father   . Arthritis Father   . COPD Father   . Colon cancer Father     DRUG ALLERGIES:  No Known Allergies  REVIEW OF SYSTEMS:   Review of Systems  Constitutional: Negative for diaphoresis, fever, malaise/fatigue and weight loss.  HENT: Negative for ear discharge, ear pain, hearing loss, nosebleeds, sore throat and tinnitus.    Eyes: Negative for blurred vision and pain.  Respiratory: Negative for cough, hemoptysis, shortness of breath and wheezing.   Cardiovascular: Negative for chest pain, palpitations, orthopnea and leg swelling.  Gastrointestinal: Negative for abdominal pain, blood in stool, constipation, diarrhea, heartburn, nausea and vomiting.  Genitourinary: Negative for dysuria, frequency and urgency.  Musculoskeletal: Negative for back pain and myalgias.  Skin: Negative for itching and rash.  Neurological: Positive for sensory change. Negative for dizziness, tingling, tremors, focal weakness, seizures, weakness and headaches.  Psychiatric/Behavioral: Negative for depression. The patient is not nervous/anxious.     MEDICATIONS AT HOME:   Prior to Admission medications   Medication Sig Start Date End Date Taking? Authorizing Provider  aspirin EC 81 MG tablet Take 81 mg by mouth daily.    [provider]  denosumab (PROLIA) 60 MG/ML SOSY injection Inject 60 mg into the skin every 6 (six) months.    [provider]  Multiple Vitamins tablet Take 1 tablet by mouth daily.  04/10/11   [provider]  nitroGLYCERIN (NITRODUR - DOSED IN MG/24 HR) 0.2 mg/hr patch Use 1/4 patch daily to the affected area Patient not taking: Reported on 10/09/2018 04/29/18   Stefanie Libel, MD  nivolumab 480 mg in sodium chloride 0.9 % 100 mL Inject 480 mg into the vein.  [provider]  Nutritional Supplements (IMMUNE ENHANCE) TABS Take 1 tablet by mouth daily.  04/10/11   [provider]  simvastatin (ZOCOR) 40 MG tablet TAKE ONE TABLET EVERY DAY 08/24/18   Jerrol Banana., MD  tamsulosin [facility]) 0.4 MG CAPS capsule TAKE 1 CAPSULE(0.4 MG) BY MOUTH DAILY 04/29/18   Jerrol Banana., MD  Vitamin D, Ergocalciferol, (DRISDOL) 1.25 MG (50000 UT) CAPS capsule TAKE 1 CAPSULE EVERY WEEK 06/17/18   Jerrol Banana., MD      VITAL SIGNS:  Blood pressure 118/65, pulse 61,  temperature 98.7 F (37.1 C), resp. rate 19, height 5\' 11"  (1.803 m), weight 73.9 kg, SpO2 97 %.  PHYSICAL EXAMINATION:  Physical Exam  GENERAL:  73 y.o.-year-old patient lying in the bed with no acute distress.  EYES: Pupils equal, round, reactive to light and accommodation. No scleral icterus. Extraocular muscles intact.  HEENT: Head atraumatic, normocephalic. Oropharynx and nasopharynx clear.  NECK:  Supple, no jugular venous distention. No thyroid enlargement, no tenderness.  LUNGS: Normal breath sounds bilaterally, no wheezing, rales,rhonchi or crepitation. No use of accessory muscles of respiration.  CARDIOVASCULAR: S1, S2 normal. No murmurs, rubs, or gallops.  ABDOMEN: Soft, nontender, nondistended. Bowel sounds present. No organomegaly or mass.  EXTREMITIES: No pedal edema, cyanosis, or clubbing.  NEUROLOGIC: Cranial nerves II through XII are intact. Muscle strength 5/5 in all extremities. Sensation intact. Gait not checked.  PSYCHIATRIC: The patient is alert and oriented x 3.  SKIN: No obvious rash, lesion, or ulcer.   LABORATORY PANEL:   CBC Recent Labs  Lab 02/07/19 2222  WBC 7.1  HGB 13.3  HCT 40.8  PLT 211   ------------------------------------------------------------------------------------------------------------------  Chemistries  Recent Labs  Lab 02/07/19 2222  NA 136  K 3.6  CL 99  CO2 28  GLUCOSE 116*  BUN 21  CREATININE 1.17  CALCIUM 9.5  AST 21  ALT 15  ALKPHOS 37*  BILITOT 0.7   ------------------------------------------------------------------------------------------------------------------  Cardiac Enzymes No results for input(s): TROPONINI in the last 168 hours. ------------------------------------------------------------------------------------------------------------------  RADIOLOGY:  Ct Head Wo Contrast  Result Date: 02/07/2019 CLINICAL DATA:  Seizure, new, nontraumatic, >40 yrs EXAM: CT HEAD WITHOUT CONTRAST TECHNIQUE: Contiguous  axial images were obtained from the base of the skull through the vertex without intravenous contrast. COMPARISON:  None. FINDINGS: Brain: No intracranial hemorrhage, mass effect, or midline shift. No hydrocephalus. The basilar cisterns are patent. No evidence of territorial infarct or acute ischemia. No extra-axial or intracranial fluid collection. Vascular: No hyperdense vessel or unexpected calcification. Skull: No fracture or focal lesion. Sinuses/Orbits: Mild mucosal thickening of few ethmoid air cells. No sinus fluid levels. Mastoid air cells are clear. Included orbits are unremarkable. Other: None. IMPRESSION: No acute intracranial abnormality or explanation for seizure. Electronically Signed   By: Keith Rake M.D.   On: 02/07/2019 23:51   Dg Chest Portable 1 View  Result Date: 02/07/2019 CLINICAL DATA:  73 year old male with seizure like activity. Syncope. EXAM: PORTABLE CHEST 1 VIEW COMPARISON:  Chest radiograph dated 04/18/2011 FINDINGS: Focal area of density in the left mid lung field, likely atelectasis or interstitial crowding. No focal consolidation, pleural effusion, or pneumothorax. The cardiac silhouette is within normal limits. No acute osseous pathology. Atherosclerotic calcification of the aortic arch. IMPRESSION: No active disease. Electronically Signed   By: Anner Crete M.D.   On: 02/07/2019 23:55   IMPRESSION AND PLAN:  73 year old male being admitted for syncope/seizure  *Syncope/seizure -History more consistent with vasovagal  episode -Monitor on telemetry -Serial troponins, check TSH, orthostatic vitals -Patient follows with Dr. Nehemiah Massed as an outpatient -CT head negative for acute pathology  *Hyperlipidemia -Continue Zocor  *Abnormal EKG - inferolateral t wave inversion -serial troponins -consult cardiology   All the records are reviewed and case discussed with ED provider. Management plans discussed with the patient, family (wife at bedside) and they are  in agreement.  CODE STATUS: Full code  TOTAL TIME TAKING CARE OF THIS PATIENT: 45 minutes.    Max Sane M.D on 02/08/2019 at 12:22 AM  Between 7am to 6pm - Pager - [phone]  After 6pm go to [email or web address] - password [email or web address] [facility]  [facility]  Hospitalists  Office  [phone]  CC: Primary care physician; Jerrol Banana., MD   Note: This dictation was prepared with Dragon dictation along with smaller phrase technology. Any transcriptional errors that result from this process are unintentional.

## 2019-02-08 NOTE — ED Notes (Signed)
Trop drawn and sent

## 2019-02-08 NOTE — ED Notes (Signed)
Admission Md at bedside to evaluate patient and discuss plan of care.

## 2019-02-08 NOTE — Consult Note (Signed)
[facility] Cardiology  CARDIOLOGY CONSULT NOTE  Patient ID: Marc Munoz MRN: [id number] DOB/AGE: [birth year] 73 y.o.  Admit date: 02/07/2019 Referring Physician Manuella Ghazi Primary Physician Rosanna Randy Primary Cardiologist Nehemiah Massed Reason for Consultation syncope  HPI: 73 year old gentleman referred for evaluation of syncope.  Patient was in his usual state of health until 9 PM 02/07/2019 when he started not to feel well, developed abdominal discomfort, had a bowel movement, with persistent dizziness.  Reports he was sitting in his recliner, his wife brought him a cold facecloth, then his eyes rolled back, started to tremble from head to toe, with brief loss of consciousness, with recovery without any evidence for post ictal symptoms.  The patient was brought to [facility] emergency room, where he has remained clinically and hemodynamically stable.  ECG revealed sinus rhythm at a rate of 62 bpm with nonspecific T wave abnormalities inferolaterally.  Admission labs notable for high sensory troponin of 15, 12, and 10.  CT was negative.  MRI is pending.  Patient currently denies chest pain or shortness of breath.  He played tennis prior to syncopal episode.  Patient has a history of malignant melanoma.  He reports experiencing a vasovagal episode years ago while receiving chemotherapy.  He reports occasional episodes of lightheadedness.  Review of systems complete and found to be negative unless listed above     Past Medical History:  Diagnosis Date  . Cancer ([place])    melanoma  . Hyperlipemia     Past Surgical History:  Procedure Laterality Date  . COLONOSCOPY N/A 05/03/2015   Procedure: COLONOSCOPY;  Surgeon: Manya Silvas, MD;  Location: [facility] ENDOSCOPY;  Service: Endoscopy;  Laterality: N/A;  . HERNIA REPAIR    . MELANOMA EXCISION      (Not in a hospital admission)  Social History   Socioeconomic History  . Marital status: Married    Spouse name: Manuela Schwartz  . Number of children: 1  . Years of education:  2  . Highest education level: Not on file  Occupational History    Employer: [employer]  Social Needs  . Financial resource strain: Not on file  . Food insecurity    Worry: Not on file    Inability: Not on file  . Transportation needs    Medical: Not on file    Non-medical: Not on file  Tobacco Use  . Smoking status: Never Smoker  . Smokeless tobacco: Never Used  Substance and Sexual Activity  . Alcohol use: Yes    Alcohol/week: 1.0 standard drinks    Types: 1 Cans of beer per week    Comment: occasional  . Drug use: No  . Sexual activity: Yes  Lifestyle  . Physical activity    Days per week: Not on file    Minutes per session: Not on file  . Stress: Not on file  Relationships  . Social Herbalist on phone: Not on file    Gets together: Not on file    Attends religious service: Not on file    Active member of club or organization: Not on file    Attends meetings of clubs or organizations: Not on file    Relationship status: Not on file  . Intimate partner violence    Fear of current or ex partner: Not on file    Emotionally abused: Not on file    Physically abused: Not on file    Forced sexual activity: Not on file  Other Topics Concern  .  Not on file  Social History Narrative  . Not on file    Family History  Problem Relation Age of Onset  . Stroke Brother   . Heart disease Mother   . Hypertension Mother   . Hypertension Father   . Arthritis Father   . COPD Father   . Colon cancer Father       Review of systems complete and found to be negative unless listed above      PHYSICAL EXAM  General: Well developed, well nourished, in no acute distress HEENT:  Normocephalic and atramatic Neck:  No JVD.  Lungs: Clear bilaterally to auscultation and percussion. Heart: HRRR . Normal S1 and S2 without gallops or murmurs.  Abdomen: Bowel sounds are positive, abdomen soft and non-tender  Msk:  Back normal, normal gait. Normal strength and  tone for age. Extremities: No clubbing, cyanosis or edema.   Neuro: Alert and oriented X 3. Psych:  Good affect, responds appropriately  Labs:   Lab Results  Component Value Date   WBC 11.1 (H) 02/08/2019   HGB 12.9 (L) 02/08/2019   HCT 39.0 02/08/2019   MCV 92.6 02/08/2019   PLT 189 02/08/2019    Recent Labs  Lab 02/07/19 2222 02/08/19 0423  NA 136 136  K 3.6 3.7  CL 99 104  CO2 28 24  BUN 21 19  CREATININE 1.17 0.95  CALCIUM 9.5 8.7*  PROT 6.9  --   BILITOT 0.7  --   ALKPHOS 37*  --   ALT 15  --   AST 21  --   GLUCOSE 116* 121*   Lab Results  Component Value Date   CKTOTAL 585 (H) 01/29/2017   TROPONINI 0.04 (HH) 01/30/2017    Lab Results  Component Value Date   CHOL 205 (H) 11/07/2018   CHOL 250 (A) 08/16/2016   CHOL 240 (A) 07/29/2015   Lab Results  Component Value Date   HDL 64 11/07/2018   HDL 87 (A) 08/16/2016   HDL 91 (A) 07/29/2015   Lab Results  Component Value Date   LDLCALC 108 (H) 11/07/2018   LDLCALC 147 08/16/2016   LDLCALC 131 07/29/2015   Lab Results  Component Value Date   TRIG 163 (H) 11/07/2018   TRIG 82 08/16/2016   TRIG 91 07/29/2015   Lab Results  Component Value Date   CHOLHDL 3.2 11/07/2018   No results found for: LDLDIRECT    Radiology: Ct Head Wo Contrast  Result Date: 02/07/2019 CLINICAL DATA:  Seizure, new, nontraumatic, >40 yrs EXAM: CT HEAD WITHOUT CONTRAST TECHNIQUE: Contiguous axial images were obtained from the base of the skull through the vertex without intravenous contrast. COMPARISON:  None. FINDINGS: Brain: No intracranial hemorrhage, mass effect, or midline shift. No hydrocephalus. The basilar cisterns are patent. No evidence of territorial infarct or acute ischemia. No extra-axial or intracranial fluid collection. Vascular: No hyperdense vessel or unexpected calcification. Skull: No fracture or focal lesion. Sinuses/Orbits: Mild mucosal thickening of few ethmoid air cells. No sinus fluid levels. Mastoid air  cells are clear. Included orbits are unremarkable. Other: None. IMPRESSION: No acute intracranial abnormality or explanation for seizure. Electronically Signed   By: Keith Rake M.D.   On: 02/07/2019 23:51   Dg Chest Portable 1 View  Result Date: 02/07/2019 CLINICAL DATA:  73 year old male with seizure like activity. Syncope. EXAM: PORTABLE CHEST 1 VIEW COMPARISON:  Chest radiograph dated 04/18/2011 FINDINGS: Focal area of density in the left mid lung field, likely atelectasis or  interstitial crowding. No focal consolidation, pleural effusion, or pneumothorax. The cardiac silhouette is within normal limits. No acute osseous pathology. Atherosclerotic calcification of the aortic arch. IMPRESSION: No active disease. Electronically Signed   By: Anner Crete M.D.   On: 02/07/2019 23:55    EKG: Sinus rhythm with nonspecific T wave abnormalities inferolaterally  ASSESSMENT AND PLAN:   1.  Syncope, sounds vasovagal in nature 2.  Abnormal ECG, no chest pain or shortness of breath, normal high-sensitivity troponin  Recommendations  1.  Agree with overall current therapy 2.  Advised patient the next time he experiences presyncopal episodes, he should lay down immediately till symptoms past 3.  Defer further cardiac diagnostics at this time 4.  Follow-up with Dr. Nehemiah Massed after discharge  Signed: Isaias Cowman MD,PhD, [facility] 02/08/2019, 9:21 AM

## 2019-02-08 NOTE — ED Notes (Signed)
Pt given breakfast tray

## 2019-02-08 NOTE — ED Notes (Signed)
Patient transported to MRI 

## 2019-02-08 NOTE — Consult Note (Addendum)
Reason for Consult:Syncope Referring Physician: Leslye Peer  CC: Syncope  HPI: Marc Munoz is an 73 y.o. male with a history of metastatic melanoma who reports that he had a treatment for his melanoma this week.  Was not particularly active for 2 days and then started back playing tennis.  ON Friday also had multiple facial lesions treated by the dermatologist.  When home began to feel some abdominal discomfort.  Used the bathroom but still felt somewhat lightheaded even after water.  Sat down and felt the onset of tunnel vision.  Called his wife who reports that initially he was pale.  He then became unconscious and then stiffened for a few seconds.  Afterward was fully responsive with no confusion noted.  Patient remained seated upright for the entire event.  Patient was then brought in for evaluation.    Past Medical History:  Diagnosis Date  . Cancer ([place])    melanoma  . Hyperlipemia     Past Surgical History:  Procedure Laterality Date  . COLONOSCOPY N/A 05/03/2015   Procedure: COLONOSCOPY;  Surgeon: Manya Silvas, MD;  [facility] ENDOSCOPY;  Service: Endoscopy;  Laterality: N/A;  . HERNIA REPAIR    . MELANOMA EXCISION      Family History  Problem Relation Age of Onset  . Stroke Brother   . Heart disease Mother   . Hypertension Mother   . Hypertension Father   . Arthritis Father   . COPD Father   . Colon cancer Father     Social History:  reports that he has never smoked. He has never used smokeless tobacco. He reports current alcohol use of about 1.0 standard drinks of alcohol per week. He reports that he does not use drugs.  No Known Allergies  Medications:  Prior to Admission medications   Medication Sig Start Date End Date Taking? Authorizing Provider  calcium-vitamin D (CALCIUM 500/D) 500-200 MG-UNIT tablet Take 1 tablet by mouth daily.   Yes [provider]  Cholecalciferol (VITAMIN D3) 10 MCG (400 UNIT) CAPS Take 1 capsule by mouth daily.   Yes  [provider]  simvastatin (ZOCOR) 40 MG tablet TAKE ONE TABLET EVERY DAY 08/24/18  Yes Jerrol Banana., MD  tamsulosin [facility]) 0.4 MG CAPS capsule TAKE 1 CAPSULE(0.4 MG) BY MOUTH DAILY 04/29/18  Yes Jerrol Banana., MD  aspirin EC 81 MG tablet Take 81 mg by mouth daily.    [provider]  denosumab (PROLIA) 60 MG/ML SOSY injection Inject 60 mg into the skin every 6 (six) months.    [provider]  Multiple Vitamins tablet Take 1 tablet by mouth daily.  04/10/11   [provider]  nitroGLYCERIN (NITRODUR - DOSED IN MG/24 HR) 0.2 mg/hr patch Use 1/4 patch daily to the affected area Patient not taking: Reported on 02/08/2019 04/29/18   Stefanie Libel, MD  nivolumab 480 mg in sodium chloride 0.9 % 100 mL Inject 480 mg into the vein.    [provider]  Nutritional Supplements (IMMUNE ENHANCE) TABS Take 1 tablet by mouth daily.  04/10/11   [provider]  Vitamin D, Ergocalciferol, (DRISDOL) 1.25 MG (50000 UT) CAPS capsule TAKE 1 CAPSULE EVERY WEEK Patient not taking: Reported on 02/08/2019 06/17/18   Jerrol Banana., MD    ROS: History obtained from the patient  General ROS: negative for - chills, fatigue, fever, night sweats, weight gain or weight loss Psychological ROS: negative for - behavioral disorder, hallucinations, memory difficulties,  mood swings or suicidal ideation Ophthalmic ROS: negative for - blurry vision, double vision, eye pain or loss of vision ENT ROS: negative for - epistaxis, nasal discharge, oral lesions, sore throat, tinnitus or vertigo Allergy and Immunology ROS: negative for - hives or itchy/watery eyes Hematological and Lymphatic ROS: negative for - bleeding problems, bruising or swollen lymph nodes Endocrine ROS: negative for - galactorrhea, hair pattern changes, polydipsia/polyuria or temperature intolerance Respiratory ROS: negative for - cough, hemoptysis, shortness of breath or  wheezing Cardiovascular ROS: negative for - chest pain, dyspnea on exertion, edema or irregular heartbeat Gastrointestinal ROS: negative for - abdominal pain, diarrhea, hematemesis, nausea/vomiting or stool incontinence Genito-Urinary ROS: negative for - dysuria, hematuria, incontinence or urinary frequency/urgency Musculoskeletal ROS: negative for - joint swelling or muscular weakness Neurological ROS: as noted in HPI Dermatological ROS: multiple skin lesions  Physical Examination: Blood pressure 118/74, pulse 71, temperature 98.7 F (37.1 C), resp. rate 17, height 5\' 11"  (1.803 m), weight 73.9 kg, SpO2 100 %.  HEENT-  Normocephalic, no lesions, without obvious abnormality.  Normal external eye and conjunctiva.  Normal TM's bilaterally.  Normal auditory canals and external ears. Normal external nose, mucus membranes and septum.  Normal pharynx. Cardiovascular- S1, S2 normal, pulses palpable throughout   Lungs- chest clear, no wheezing, rales, normal symmetric air entry Abdomen- soft, non-tender; bowel sounds normal; no masses,  no organomegaly Extremities- no edema Lymph-no adenopathy palpable Musculoskeletal-no joint tenderness, deformity or swelling  Neurological Examination   Mental Status: Alert, oriented, thought content appropriate.  Speech fluent without evidence of aphasia.  Able to follow 3 step commands without difficulty. Cranial Nerves: II: Discs flat bilaterally; Visual fields grossly normal, pupils equal, round, reactive to light and accommodation III,IV, VI: ptosis not present, extra-ocular motions intact bilaterally V,VII: smile symmetric, facial light touch sensation normal bilaterally VIII: hearing normal bilaterally IX,X: gag reflex present XI: bilateral shoulder shrug XII: midline tongue extension Motor: Right : Upper extremity   5/5    Left:     Upper extremity   5/5  Lower extremity   5/5     Lower extremity   5/5 Tone and bulk:normal tone throughout; no  atrophy noted Sensory: Pinprick and light touch intact throughout, bilaterally Deep Tendon Reflexes: Symmetric throughout Plantars: Right: downgoing   Left: downgoing Cerebellar: Normal finger-to-nose and normal heel-to-shin testing bilaterally Gait: not tested due to safety concerns    Laboratory Studies:   Basic Metabolic Panel: Recent Labs  Lab 02/07/19 2222 02/08/19 0423  NA 136 136  K 3.6 3.7  CL 99 104  CO2 28 24  GLUCOSE 116* 121*  BUN 21 19  CREATININE 1.17 0.95  CALCIUM 9.5 8.7*    Liver Function Tests: Recent Labs  Lab 02/07/19 2222  AST 21  ALT 15  ALKPHOS 37*  BILITOT 0.7  PROT 6.9  ALBUMIN 4.1   No results for input(s): LIPASE, AMYLASE in the last 168 hours. No results for input(s): AMMONIA in the last 168 hours.  CBC: Recent Labs  Lab 02/07/19 2222 02/08/19 0423  WBC 7.1 11.1*  NEUTROABS 4.6  --   HGB 13.3 12.9*  HCT 40.8 39.0  MCV 92.1 92.6  PLT 211 189    Cardiac Enzymes: No results for input(s): CKTOTAL, CKMB, CKMBINDEX, TROPONINI in the last 168 hours.  BNP: Invalid input(s): POCBNP  CBG: No results for input(s): GLUCAP in the last 168 hours.  Microbiology: Results for orders placed or performed during the hospital encounter of 02/07/19  SARS  CORONAVIRUS 2 (TAT 6-24 HRS) Nasopharyngeal Nasopharyngeal Swab     Status: None   Collection Time: 02/07/19 11:23 PM   Specimen: Nasopharyngeal Swab  Result Value Ref Range Status   SARS Coronavirus 2 NEGATIVE NEGATIVE Final    Comment: (NOTE) SARS-CoV-2 target nucleic acids are NOT DETECTED. The SARS-CoV-2 RNA is generally detectable in upper and lower respiratory specimens during the acute phase of infection. Negative results do not preclude SARS-CoV-2 infection, do not rule out co-infections with other pathogens, and should not be used as the sole basis for treatment or other patient management decisions. Negative results must be combined with clinical observations, patient  history, and epidemiological information. The expected result is Negative. Fact Sheet for Patients: SugarRoll.be Fact Sheet for Healthcare Providers: https://www.woods-mathews.com/ This test is not yet approved or cleared by the [place] FDA and  has been authorized for detection and/or diagnosis of SARS-CoV-2 by FDA under an Emergency Use Authorization (EUA). This EUA will remain  in effect (meaning this test can be used) for the duration of the COVID-19 declaration under Section 56 4(b)(1) of the Act, 21 U.S.C. section 360bbb-3(b)(1), unless the authorization is terminated or revoked sooner. Performed at [facility], [place] [address], [place], [place] [address]     Coagulation Studies: No results for input(s): LABPROT, INR in the last 72 hours.  Urinalysis:  Recent Labs  Lab 02/07/19 2323  COLORURINE YELLOW*  LABSPEC 1.009  PHURINE 6.0  GLUCOSEU NEGATIVE  HGBUR NEGATIVE  BILIRUBINUR NEGATIVE  KETONESUR NEGATIVE  PROTEINUR NEGATIVE  NITRITE NEGATIVE  LEUKOCYTESUR NEGATIVE    Lipid Panel:     Component Value Date/Time   CHOL 205 (H) 11/07/2018 0940   TRIG 163 (H) 11/07/2018 0940   HDL 64 11/07/2018 0940   CHOLHDL 3.2 11/07/2018 0940   LDLCALC 108 (H) 11/07/2018 0940    HgbA1C: No results found for: HGBA1C  Urine Drug Screen:  No results found for: LABOPIA, COCAINSCRNUR, LABBENZ, AMPHETMU, THCU, LABBARB  Alcohol Level: No results for input(s): ETH in the last 168 hours.  Other results: EKG: sinus rhythm at 62 bpm.  Imaging: Ct Head Wo Contrast  Result Date: 02/07/2019 CLINICAL DATA:  Seizure, new, nontraumatic, >41 yrs EXAM: CT HEAD WITHOUT CONTRAST TECHNIQUE: Contiguous axial images were obtained from the base of the skull through the vertex without intravenous contrast. COMPARISON:  None. FINDINGS: Brain: No intracranial hemorrhage, mass effect, or midline shift. No hydrocephalus. The basilar cisterns are  patent. No evidence of territorial infarct or acute ischemia. No extra-axial or intracranial fluid collection. Vascular: No hyperdense vessel or unexpected calcification. Skull: No fracture or focal lesion. Sinuses/Orbits: Mild mucosal thickening of few ethmoid air cells. No sinus fluid levels. Mastoid air cells are clear. Included orbits are unremarkable. Other: None. IMPRESSION: No acute intracranial abnormality or explanation for seizure. Electronically Signed   By: Keith Rake M.D.   On: 02/07/2019 23:51   Dg Chest Portable 1 View  Result Date: 02/07/2019 CLINICAL DATA:  73 year old male with seizure like activity. Syncope. EXAM: PORTABLE CHEST 1 VIEW COMPARISON:  Chest radiograph dated 04/18/2011 FINDINGS: Focal area of density in the left mid lung field, likely atelectasis or interstitial crowding. No focal consolidation, pleural effusion, or pneumothorax. The cardiac silhouette is within normal limits. No acute osseous pathology. Atherosclerotic calcification of the aortic arch. IMPRESSION: No active disease. Electronically Signed   By: Anner Crete M.D.   On: 02/07/2019 23:55     Assessment/Plan: 73 year old male with a history  of metastatic melanoma who reports that he had a treatment for his melanoma this week, presenting after what is described as a syncopal seizure. Head CT reviewed and shows no acute changes.  Patient at baseline and with a normal neurological examination.  Further work up indicated to rule out other possible etiologies.   Recommendations: 1. MRI of the brain with and without contrast 2. Orhtostatic vitals. 3. Telemetry 4. If patient remains stable may have EEG testing as an outpatient.  No anticonvulsant therapy indicated at this time.    Alexis Goodell, MD Neurology [phone] 02/08/2019, 10:10 AM

## 2019-02-08 NOTE — ED Notes (Signed)
Patient being screened by MRI at this time

## 2019-02-08 NOTE — ED Notes (Signed)
Pt given lunch tray.

## 2019-02-08 NOTE — ED Notes (Signed)
Patient unhooked to wash hands. PAtient eating apples and peanut butter brought from home by wife

## 2019-02-08 NOTE — ED Notes (Signed)
Report from jeanette, rn.  

## 2019-02-09 ENCOUNTER — Observation Stay
Admit: 2019-02-09 | Discharge: 2019-02-09 | Disposition: A | Discharge status: Home / Self Care | Payer: 59 | Attending: Internal Medicine | Admitting: Internal Medicine

## 2019-02-09 ENCOUNTER — Observation Stay: Admit: 2019-02-09 | Payer: 59

## 2019-02-09 DIAGNOSIS — R9431 Abnormal electrocardiogram [ECG] [EKG]: Secondary | ICD-10-CM | POA: Diagnosis not present

## 2019-02-09 DIAGNOSIS — R55 Syncope and collapse: Secondary | ICD-10-CM | POA: Diagnosis not present

## 2019-02-09 DIAGNOSIS — E785 Hyperlipidemia, unspecified: Secondary | ICD-10-CM | POA: Diagnosis not present

## 2019-02-09 DIAGNOSIS — R9089 Other abnormal findings on diagnostic imaging of central nervous system: Secondary | ICD-10-CM | POA: Diagnosis not present

## 2019-02-09 DIAGNOSIS — I6389 Other cerebral infarction: Secondary | ICD-10-CM | POA: Diagnosis not present

## 2019-02-09 LAB — BASIC METABOLIC PANEL
Anion gap: 6 (ref 5–15)
BUN: 15 mg/dL (ref 8–23)
CO2: 25 mmol/L (ref 22–32)
Calcium: 8.4 mg/dL — ABNORMAL LOW (ref 8.9–10.3)
Chloride: 110 mmol/L (ref 98–111)
Creatinine, Ser: 0.95 mg/dL (ref 0.61–1.24)
GFR calc Af Amer: >60 mL/min (ref >60)
GFR calc non Af Amer: >60 mL/min (ref >60)
Glucose, Bld: 104 mg/dL — ABNORMAL HIGH (ref 70–99)
Potassium: 3.9 mmol/L (ref 3.5–5.1)
Sodium: 141 mmol/L (ref 135–145)

## 2019-02-09 LAB — GLUCOSE, CAPILLARY: Glucose-Capillary: 117 mg/dL — ABNORMAL HIGH (ref 70–99)

## 2019-02-09 LAB — LIPID PANEL
Cholesterol: 162 mg/dL (ref 0–200)
HDL: 53 mg/dL (ref >40)
LDL Cholesterol: 85 mg/dL (ref 0–99)
Total CHOL/HDL Ratio: 3.1 RATIO
Triglycerides: 118 mg/dL (ref <150)
VLDL: 24 mg/dL (ref 0–40)

## 2019-02-09 NOTE — Discharge Summary (Signed)
[place] [facility] NAME: Marc Munoz    MR#:  [id number]  DATE OF BIRTH:  1945/08/06  DATE OF ADMISSION:  02/07/2019 ADMITTING PHYSICIAN: Max Sane, MD  DATE OF DISCHARGE: 02/09/2019  PRIMARY CARE PHYSICIAN: Jerrol Banana., MD    ADMISSION DIAGNOSIS:  syncope  DISCHARGE DIAGNOSIS:  Active Problems:   Syncope   SECONDARY DIAGNOSIS:   Past Medical History:  Diagnosis Date  . Cancer ([place])    melanoma  . Hyperlipemia     HOSPITAL COURSE:   1.  Vasovagal syncope with convulsion.  Patient was seen by neurology and cardiology.  No need for antiseizure medication because this was likely vasovagal syncope with convulsion.  Patient feeling much better after IV fluid hydration and ready to be discharged home. 2.  Abnormal MRI of the brain with a 3 mm focus in the right caudate nuclear's.  The radiologist read this as could be chronic infarct versus early metastases.  Neurology wanted to do a stroke work-up.  Patient's echocardiogram with bubble study was negative.  Carotid ultrasound did show some plaque but less than 50%.  Patient was advised to be on aspirin.  The patient did not want to change his statin over to a high intensity statin so we will continue with Zocor.  LDL 85.  Goal less than 70.  Since the patient does have a history of melanoma that is metastatic need to follow-up with oncology as outpatient for a close clinical follow-up and repeat MRI of the brain in 6 to 12 weeks.  The report of his MRI of the brain back in February was negative. 3.  Hyperlipidemia on Zocor.  LDL 85. 4.  Abnormal EKG.  Flipped T waves V3 through 6.  Seen by cardiology.  Patient on aspirin.  DISCHARGE CONDITIONS:   Satisfactory  CONSULTS OBTAINED:  Treatment Team:  Catarina Hartshorn, MD Isaias Cowman, MD  DRUG ALLERGIES:  No Known Allergies  DISCHARGE MEDICATIONS:   Allergies as of 02/09/2019   No Known Allergies     Medication  List    STOP taking these medications   ibuprofen 200 MG tablet Commonly known as: ADVIL   nitroGLYCERIN 0.2 mg/hr patch Commonly known as: NITRODUR - Dosed in mg/24 hr     TAKE these medications   aspirin EC 81 MG tablet Take 81 mg by mouth daily.   Calcium 500/D 500-200 MG-UNIT tablet Generic drug: calcium-vitamin D Take 1 tablet by mouth daily.   denosumab 60 MG/ML Sosy injection Commonly known as: PROLIA Inject 60 mg into the skin every 6 (six) months.   Immune Enhance Tabs Take 1 tablet by mouth daily.   Multiple Vitamins tablet Take 1 tablet by mouth daily.   nivolumab 480 mg in sodium chloride 0.9 % 100 mL Inject 480 mg into the vein.   simvastatin 40 MG tablet Commonly known as: ZOCOR TAKE ONE TABLET EVERY DAY   tamsulosin 0.4 MG Caps capsule Commonly known as: FLOMAX TAKE 1 CAPSULE(0.4 MG) BY MOUTH DAILY   Vitamin D (Ergocalciferol) 1.25 MG (50000 UT) Caps capsule Commonly known as: DRISDOL TAKE 1 CAPSULE EVERY WEEK   Vitamin D3 10 MCG (400 UNIT) Caps Take 1 capsule by mouth daily.        DISCHARGE INSTRUCTIONS:   Follow-up PMD 5 days Follow-up oncology 2 weeks  If you experience worsening of your admission symptoms, develop shortness of breath, life threatening emergency, suicidal or homicidal thoughts you must seek  medical attention immediately by calling 911 or calling your MD immediately  if symptoms less severe.  You Must read complete instructions/literature along with all the possible adverse reactions/side effects for all the Medicines you take and that have been prescribed to you. Take any new Medicines after you have completely understood and accept all the possible adverse reactions/side effects.   Please note  You were cared for by a hospitalist during your hospital stay. If you have any questions about your discharge medications or the care you received while you were in the hospital after you are discharged, you can call the unit and  asked to speak with the hospitalist on call if the hospitalist that took care of you is not available. Once you are discharged, your primary care physician will handle any further medical issues. Please note that NO REFILLS for any discharge medications will be authorized once you are discharged, as it is imperative that you return to your primary care physician (or establish a relationship with a primary care physician if you do not have one) for your aftercare needs so that they can reassess your need for medications and monitor your lab values.    Today   CHIEF COMPLAINT:   Chief Complaint  Patient presents with  . Seizures    HISTORY OF PRESENT ILLNESS:  Marc Munoz  is a 73 y.o. male came in with syncope and convulsion   VITAL SIGNS:  Blood pressure 123/75, pulse 63, temperature 97.9 F (36.6 C), temperature source Oral, resp. rate 18, height 5\' 6"  (1.676 m), weight 72.5 kg, SpO2 99 %.  I/O:    Intake/Output Summary (Last 24 hours) at 02/09/2019 1349 Last data filed at 02/09/2019 1004 Gross per 24 hour  Intake 1199.73 ml  Output 350 ml  Net 849.73 ml    PHYSICAL EXAMINATION:  GENERAL:  73 y.o.-year-old patient lying in the bed with no acute distress.  EYES: Pupils equal, round, reactive to light and accommodation. No scleral icterus. Extraocular muscles intact.  HEENT: Head atraumatic, normocephalic. Oropharynx and nasopharynx clear.  NECK:  Supple, no jugular venous distention. No thyroid enlargement, no tenderness.  LUNGS: Normal breath sounds bilaterally, no wheezing, rales,rhonchi or crepitation. No use of accessory muscles of respiration.  CARDIOVASCULAR: S1, S2 normal. No murmurs, rubs, or gallops.  ABDOMEN: Soft, non-tender, non-distended. Bowel sounds present. No organomegaly or mass.  EXTREMITIES: No pedal edema, cyanosis, or clubbing.  NEUROLOGIC: Cranial nerves II through XII are intact. Muscle strength 5/5 in all extremities. Sensation intact. Gait not checked.   PSYCHIATRIC: The patient is alert and oriented x 3.  SKIN: No obvious rash, lesion, or ulcer.   DATA REVIEW:   CBC Recent Labs  Lab 02/08/19 0423  WBC 11.1*  HGB 12.9*  HCT 39.0  PLT 189    Chemistries  Recent Labs  Lab 02/07/19 2222  02/09/19 0525  NA 136   < > 141  K 3.6   < > 3.9  CL 99   < > 110  CO2 28   < > 25  GLUCOSE 116*   < > 104*  BUN 21   < > 15  CREATININE 1.17   < > 0.95  CALCIUM 9.5   < > 8.4*  AST 21  --   --   ALT 15  --   --   ALKPHOS 37*  --   --   BILITOT 0.7  --   --    < > = values in this  interval not displayed.     Microbiology Results  Results for orders placed or performed during the hospital encounter of 02/07/19  SARS CORONAVIRUS 2 (TAT 6-24 HRS) Nasopharyngeal Nasopharyngeal Swab     Status: None   Collection Time: 02/07/19 11:23 PM   Specimen: Nasopharyngeal Swab  Result Value Ref Range Status   SARS Coronavirus 2 NEGATIVE NEGATIVE Final    Comment: (NOTE) SARS-CoV-2 target nucleic acids are NOT DETECTED. The SARS-CoV-2 RNA is generally detectable in upper and lower respiratory specimens during the acute phase of infection. Negative results do not preclude SARS-CoV-2 infection, do not rule out co-infections with other pathogens, and should not be used as the sole basis for treatment or other patient management decisions. Negative results must be combined with clinical observations, patient history, and epidemiological information. The expected result is Negative. Fact Sheet for Patients: SugarRoll.be Fact Sheet for Healthcare Providers: https://www.woods-mathews.com/ This test is not yet approved or cleared by the [place] FDA and  has been authorized for detection and/or diagnosis of SARS-CoV-2 by FDA under an Emergency Use Authorization (EUA). This EUA will remain  in effect (meaning this test can be used) for the duration of the COVID-19 declaration under Section 56 4(b)(1) of  the Act, 21 U.S.C. section 360bbb-3(b)(1), unless the authorization is terminated or revoked sooner. Performed at [facility], [place] [address] [place], [place] [address]     RADIOLOGY:  Ct Head Wo Contrast  Result Date: 02/07/2019 CLINICAL DATA:  Seizure, new, nontraumatic, >40 yrs EXAM: CT HEAD WITHOUT CONTRAST TECHNIQUE: Contiguous axial images were obtained from the base of the skull through the vertex without intravenous contrast. COMPARISON:  None. FINDINGS: Brain: No intracranial hemorrhage, mass effect, or midline shift. No hydrocephalus. The basilar cisterns are patent. No evidence of territorial infarct or acute ischemia. No extra-axial or intracranial fluid collection. Vascular: No hyperdense vessel or unexpected calcification. Skull: No fracture or focal lesion. Sinuses/Orbits: Mild mucosal thickening of few ethmoid air cells. No sinus fluid levels. Mastoid air cells are clear. Included orbits are unremarkable. Other: None. IMPRESSION: No acute intracranial abnormality or explanation for seizure. Electronically Signed   By: Keith Rake M.D.   On: 02/07/2019 23:51   Mr Jeri Cos [id number] Contrast  Result Date: 02/08/2019 CLINICAL DATA:  Seizure, new, nontraumatic. History of metastatic melanoma admitted with syncope. EXAM: MRI HEAD WITHOUT AND WITH CONTRAST TECHNIQUE: Multiplanar, multiecho pulse sequences of the brain and surrounding structures were obtained without and with intravenous contrast. CONTRAST:  7.49mL GADAVIST GADOBUTROL 1 MMOL/ML IV SOLN COMPARISON:  Head CT from yesterday FINDINGS: Brain: 3 mm lesion along the right caudate head and adjacent white matter with mild peripheral T1 shortening, no blood products by gradient imaging, and no detectable enhancement. Centrally this has a cystic appearance. No acute infarct, hydrocephalus, or collection. The brain is normal appearing other than the small lesion. No atrophy or white matter disease Vascular: Major flow voids and  vascular enhancements are preserved Skull and upper cervical spine: Negative for marrow lesion Sinuses/Orbits: Negative IMPRESSION: 1. 3 mm lesion at the right caudate nucleus which could be chronic lacune or a small metastatic focus. Recommend short follow-up brain MRI in 6-12 weeks. 2. No cortical finding to explain seizure. Electronically Signed   By: Monte Fantasia M.D.   On: 02/08/2019 11:43   US Carotid Bilateral  Result Date: 02/08/2019 CLINICAL DATA:  Stroke.  Syncope.  Hyperlipidemia. EXAM: BILATERAL CAROTID DUPLEX ULTRASOUND TECHNIQUE: Pearline Cables scale imaging, color Doppler and duplex ultrasound  were performed of bilateral carotid and vertebral arteries in the neck. COMPARISON:  CT 02/09/2005 by report only FINDINGS: Criteria: Quantification of carotid stenosis is based on velocity parameters that correlate the residual internal carotid diameter with NASCET-based stenosis levels, using the diameter of the distal internal carotid lumen as the denominator for stenosis measurement. The following velocity measurements were obtained: RIGHT ICA: 115/33 cm/sec CCA: [id number] cm/sec SYSTOLIC ICA/CCA RATIO:  1.1 ECA: 164 cm/sec LEFT ICA: 128/39 cm/sec CCA: [id number] cm/sec SYSTOLIC ICA/CCA RATIO:  0.9 ECA: 143 cm/sec RIGHT CAROTID ARTERY: Mild smooth plaque in the bulb and at the ICA origin without high-grade stenosis. Normal waveforms and color Doppler signal. RIGHT VERTEBRAL ARTERY:  Normal flow direction and waveform. LEFT CAROTID ARTERY: Mild eccentric nonocclusive plaque in the bulb and ICA origin. Normal waveforms and color Doppler signal. No high-grade stenosis. LEFT VERTEBRAL ARTERY:  Normal flow direction and waveform. IMPRESSION: 1. Bilateral carotid bifurcation plaque resulting in less than 50% diameter ICA stenosis. 2. Antegrade bilateral vertebral arterial flow. Electronically Signed   By: Lucrezia Europe M.D.   On: 02/08/2019 13:44   Dg Chest Portable 1 View  Result Date: 02/07/2019 CLINICAL DATA:   73 year old male with seizure like activity. Syncope. EXAM: PORTABLE CHEST 1 VIEW COMPARISON:  Chest radiograph dated 04/18/2011 FINDINGS: Focal area of density in the left mid lung field, likely atelectasis or interstitial crowding. No focal consolidation, pleural effusion, or pneumothorax. The cardiac silhouette is within normal limits. No acute osseous pathology. Atherosclerotic calcification of the aortic arch. IMPRESSION: No active disease. Electronically Signed   By: Anner Crete M.D.   On: 02/07/2019 23:55    Management plans discussed with the patient, family and they are in agreement.  CODE STATUS:     Code Status Orders  (From admission, onward)         Start     Ordered   02/08/19 0206  Full code  Continuous     02/08/19 0205        Code Status History    Date Active Date Inactive Code Status Order ID Comments User Context   01/29/2017 1916 01/30/2017 1157 Full Code [id number]  Epifanio Lesches, MD ED   Advance Care Planning Activity      TOTAL TIME TAKING CARE OF THIS PATIENT: 34 minutes.    Loletha Grayer M.D on 02/09/2019 at 1:49 PM  Between 7am to 6pm - Pager - [phone]  After 6pm go to [email or web address] - password [email or web address] [facility]  [phone]  CC: Primary care physician; Jerrol Banana., MD,  DR Berle Mull

## 2019-02-09 NOTE — Progress Notes (Signed)
*  PRELIMINARY RESULTS* Echocardiogram 2D Echocardiogram has been performed. Bubble Study (Saline Microcavitation) requested and completed on this study.  Marc Munoz 02/09/2019, 12:30 PM

## 2019-02-09 NOTE — Progress Notes (Addendum)
Subjective: Patient stable overnight.  No new neurological complaints.    Objective: Current vital signs: BP 123/75 (BP Location: Left Arm)   Pulse 63   Temp 97.9 F (36.6 C) (Oral)   Resp 18   Ht 5\' 6"  (1.676 m)   Wt 72.5 kg   SpO2 99%   BMI 25.81 kg/m  Vital signs in last 24 hours: Temp:  [97.9 F (36.6 C)-98.5 F (36.9 C)] 97.9 F (36.6 C) (09/13 0735) Pulse Rate:  [62-70] 63 (09/13 0735) Resp:  [16-19] 18 (09/13 0735) BP: (112-136)/(60-75) 123/75 (09/13 0735) SpO2:  [97 %-100 %] 99 % (09/13 0735) Weight:  [72.2 kg-72.5 kg] 72.5 kg (09/13 0423)  Intake/Output from previous day: 09/12 0701 - 09/13 0700 In: 1611 [I.V.:1611] Out: 350 [Urine:350] Intake/Output this shift: Total I/O In: 240 [P.O.:240] Out: 0  Nutritional status:  Diet Order            Diet Heart Room service appropriate? Yes; Fluid consistency: Thin  Diet effective now              Neurologic Exam: Mental Status: Alert, oriented, thought content appropriate.  Speech fluent without evidence of aphasia.  Able to follow 3 step commands without difficulty. Cranial Nerves: II: Discs flat bilaterally; Visual fields grossly normal, pupils equal, round, reactive to light and accommodation III,IV, VI: ptosis not present, extra-ocular motions intact bilaterally V,VII: smile symmetric, facial light touch sensation normal bilaterally VIII: hearing normal bilaterally IX,X: gag reflex present XI: bilateral shoulder shrug XII: midline tongue extension Motor: 5/5 throughout Sensory: Pinprick and light touch intact throughout, bilaterally   Lab Results: Basic Metabolic Panel: Recent Labs  Lab 02/07/19 2222 02/08/19 0423 02/09/19 0525  NA 136 136 141  K 3.6 3.7 3.9  CL 99 104 110  CO2 28 24 25   GLUCOSE 116* 121* 104*  BUN 21 19 15   CREATININE 1.17 0.95 0.95  CALCIUM 9.5 8.7* 8.4*    Liver Function Tests: Recent Labs  Lab 02/07/19 2222  AST 21  ALT 15  ALKPHOS 37*  BILITOT 0.7  PROT 6.9   ALBUMIN 4.1   No results for input(s): LIPASE, AMYLASE in the last 168 hours. No results for input(s): AMMONIA in the last 168 hours.  CBC: Recent Labs  Lab 02/07/19 2222 02/08/19 0423  WBC 7.1 11.1*  NEUTROABS 4.6  --   HGB 13.3 12.9*  HCT 40.8 39.0  MCV 92.1 92.6  PLT 211 189    Cardiac Enzymes: No results for input(s): CKTOTAL, CKMB, CKMBINDEX, TROPONINI in the last 168 hours.  Lipid Panel: Recent Labs  Lab 02/09/19 0525  CHOL 162  TRIG 118  HDL 53  CHOLHDL 3.1  VLDL 24  LDLCALC 85    CBG: Recent Labs  Lab 02/08/19 1115 02/09/19 0736  GLUCAP 95 117*    Microbiology: Results for orders placed or performed during the hospital encounter of 02/07/19  SARS CORONAVIRUS 2 (TAT 6-24 HRS) Nasopharyngeal Nasopharyngeal Swab     Status: None   Collection Time: 02/07/19 11:23 PM   Specimen: Nasopharyngeal Swab  Result Value Ref Range Status   SARS Coronavirus 2 NEGATIVE NEGATIVE Final    Comment: (NOTE) SARS-CoV-2 target nucleic acids are NOT DETECTED. The SARS-CoV-2 RNA is generally detectable in upper and lower respiratory specimens during the acute phase of infection. Negative results do not preclude SARS-CoV-2 infection, do not rule out co-infections with other pathogens, and should not be used as the sole basis for treatment or other patient management  decisions. Negative results must be combined with clinical observations, patient history, and epidemiological information. The expected result is Negative. Fact Sheet for Patients: SugarRoll.be Fact Sheet for Healthcare Providers: https://www.woods-mathews.com/ This test is not yet approved or cleared by the [place] FDA and  has been authorized for detection and/or diagnosis of SARS-CoV-2 by FDA under an Emergency Use Authorization (EUA). This EUA will remain  in effect (meaning this test can be used) for the duration of the COVID-19 declaration under Section  56 4(b)(1) of the Act, 21 U.S.C. section 360bbb-3(b)(1), unless the authorization is terminated or revoked sooner. Performed at [facility], [place] [address] [place], [place] [address]     Coagulation Studies: No results for input(s): LABPROT, INR in the last 72 hours.  Imaging: Ct Head Wo Contrast  Result Date: 02/07/2019 CLINICAL DATA:  Seizure, new, nontraumatic, >40 yrs EXAM: CT HEAD WITHOUT CONTRAST TECHNIQUE: Contiguous axial images were obtained from the base of the skull through the vertex without intravenous contrast. COMPARISON:  None. FINDINGS: Brain: No intracranial hemorrhage, mass effect, or midline shift. No hydrocephalus. The basilar cisterns are patent. No evidence of territorial infarct or acute ischemia. No extra-axial or intracranial fluid collection. Vascular: No hyperdense vessel or unexpected calcification. Skull: No fracture or focal lesion. Sinuses/Orbits: Mild mucosal thickening of few ethmoid air cells. No sinus fluid levels. Mastoid air cells are clear. Included orbits are unremarkable. Other: None. IMPRESSION: No acute intracranial abnormality or explanation for seizure. Electronically Signed   By: Keith Rake M.D.   On: 02/07/2019 23:51   Mr Jeri Cos [id number] Contrast  Result Date: 02/08/2019 CLINICAL DATA:  Seizure, new, nontraumatic. History of metastatic melanoma admitted with syncope. EXAM: MRI HEAD WITHOUT AND WITH CONTRAST TECHNIQUE: Multiplanar, multiecho pulse sequences of the brain and surrounding structures were obtained without and with intravenous contrast. CONTRAST:  7.34mL GADAVIST GADOBUTROL 1 MMOL/ML IV SOLN COMPARISON:  Head CT from yesterday FINDINGS: Brain: 3 mm lesion along the right caudate head and adjacent white matter with mild peripheral T1 shortening, no blood products by gradient imaging, and no detectable enhancement. Centrally this has a cystic appearance. No acute infarct, hydrocephalus, or collection. The brain is normal appearing  other than the small lesion. No atrophy or white matter disease Vascular: Major flow voids and vascular enhancements are preserved Skull and upper cervical spine: Negative for marrow lesion Sinuses/Orbits: Negative IMPRESSION: 1. 3 mm lesion at the right caudate nucleus which could be chronic lacune or a small metastatic focus. Recommend short follow-up brain MRI in 6-12 weeks. 2. No cortical finding to explain seizure. Electronically Signed   By: Monte Fantasia M.D.   On: 02/08/2019 11:43   US Carotid Bilateral  Result Date: 02/08/2019 CLINICAL DATA:  Stroke.  Syncope.  Hyperlipidemia. EXAM: BILATERAL CAROTID DUPLEX ULTRASOUND TECHNIQUE: Pearline Cables scale imaging, color Doppler and duplex ultrasound were performed of bilateral carotid and vertebral arteries in the neck. COMPARISON:  CT 02/09/2005 by report only FINDINGS: Criteria: Quantification of carotid stenosis is based on velocity parameters that correlate the residual internal carotid diameter with NASCET-based stenosis levels, using the diameter of the distal internal carotid lumen as the denominator for stenosis measurement. The following velocity measurements were obtained: RIGHT ICA: 115/33 cm/sec CCA: [id number] cm/sec SYSTOLIC ICA/CCA RATIO:  1.1 ECA: 164 cm/sec LEFT ICA: 128/39 cm/sec CCA: [id number] cm/sec SYSTOLIC ICA/CCA RATIO:  0.9 ECA: 143 cm/sec RIGHT CAROTID ARTERY: Mild smooth plaque in the bulb and at the ICA origin without high-grade stenosis. Normal waveforms and  color Doppler signal. RIGHT VERTEBRAL ARTERY:  Normal flow direction and waveform. LEFT CAROTID ARTERY: Mild eccentric nonocclusive plaque in the bulb and ICA origin. Normal waveforms and color Doppler signal. No high-grade stenosis. LEFT VERTEBRAL ARTERY:  Normal flow direction and waveform. IMPRESSION: 1. Bilateral carotid bifurcation plaque resulting in less than 50% diameter ICA stenosis. 2. Antegrade bilateral vertebral arterial flow. Electronically Signed   By: Lucrezia Europe M.D.   On:  02/08/2019 13:44   Dg Chest Portable 1 View  Result Date: 02/07/2019 CLINICAL DATA:  73 year old male with seizure like activity. Syncope. EXAM: PORTABLE CHEST 1 VIEW COMPARISON:  Chest radiograph dated 04/18/2011 FINDINGS: Focal area of density in the left mid lung field, likely atelectasis or interstitial crowding. No focal consolidation, pleural effusion, or pneumothorax. The cardiac silhouette is within normal limits. No acute osseous pathology. Atherosclerotic calcification of the aortic arch. IMPRESSION: No active disease. Electronically Signed   By: Anner Crete M.D.   On: 02/07/2019 23:55    Medications:  I have reviewed the patient's current medications. Scheduled: . aspirin EC  81 mg Oral Daily  . docusate sodium  100 mg Oral BID  . heparin  5,000 Units Subcutaneous Q8H  . simvastatin  40 mg Oral Daily    Assessment/Plan: 73 year old male with a history of metastatic melanoma presenting after what is described as a syncopal seizure. Head CT reviewed and shows no acute changes.  MRI of the brain reviewed and shows a 48mm right caudate lesion, chronic lacune versus small metastatic focus.  Patient at baseline and with a normal neurological examination.  It is not clear that presentation related to that lesion.   Stroke work up ordered.  Patient initially refused echocardiogram but will now have it performed.  Carotid dopplers show no evidence of hemodynamically significant stenosis.  A1c pending, LDL 85.    Recommendations: 1. ASA 81mg  daily 2. Aggressive lipid management with target LDL<70 3. Echocardiogram pending 4. After discharge patient to follow up with oncology.  Repeat imaging recommended in 6-12 weeks.     LOS: 0 days   Alexis Goodell, MD Neurology [phone] 02/09/2019  10:38 AM

## 2019-02-09 NOTE — Progress Notes (Signed)
[facility] Cardiology  SUBJECTIVE: Patient laying comfortably in bed, denies chest pain, shortness of breath, palpitations, heart racing, presyncope or syncope   Vitals:   02/08/19 1543 02/08/19 1921 02/09/19 0423 02/09/19 0735  BP: 136/73 123/66 112/60 123/75  Pulse: 64 68 70 63  Resp:  18 16 18   Temp: 98.2 F (36.8 C) 98.4 F (36.9 C) 98.5 F (36.9 C) 97.9 F (36.6 C)  TempSrc: Oral Oral Oral Oral  SpO2:  98% 97% 99%  Weight: 72.2 kg  72.5 kg   Height: 5\' 6"  (1.676 m)        Intake/Output Summary (Last 24 hours) at 02/09/2019 [id number] Last data filed at 02/09/2019 0735 Gross per 24 hour  Intake 1610.98 ml  Output 350 ml  Net 1260.98 ml      PHYSICAL EXAM  General: Well developed, well nourished, in no acute distress HEENT:  Normocephalic and atramatic Neck:  No JVD.  Lungs: Clear bilaterally to auscultation and percussion. Heart: HRRR . Normal S1 and S2 without gallops or murmurs.  Abdomen: Bowel sounds are positive, abdomen soft and non-tender  Msk:  Back normal, normal gait. Normal strength and tone for age. Extremities: No clubbing, cyanosis or edema.   Neuro: Alert and oriented X 3. Psych:  Good affect, responds appropriately   LABS: Basic Metabolic Panel: Recent Labs    02/08/19 0423 02/09/19 0525  NA 136 141  K 3.7 3.9  CL 104 110  CO2 24 25  GLUCOSE 121* 104*  BUN 19 15  CREATININE 0.95 0.95  CALCIUM 8.7* 8.4*   Liver Function Tests: Recent Labs    02/07/19 2222  AST 21  ALT 15  ALKPHOS 37*  BILITOT 0.7  PROT 6.9  ALBUMIN 4.1   No results for input(s): LIPASE, AMYLASE in the last 72 hours. CBC: Recent Labs    02/07/19 2222 02/08/19 0423  WBC 7.1 11.1*  NEUTROABS 4.6  --   HGB 13.3 12.9*  HCT 40.8 39.0  MCV 92.1 92.6  PLT 211 189   Cardiac Enzymes: No results for input(s): CKTOTAL, CKMB, CKMBINDEX, TROPONINI in the last 72 hours. BNP: Invalid input(s): POCBNP D-Dimer: No results for input(s): DDIMER in the last 72 hours. Hemoglobin  A1C: No results for input(s): HGBA1C in the last 72 hours. Fasting Lipid Panel: Recent Labs    02/09/19 0525  CHOL 162  HDL 53  LDLCALC 85  TRIG 118  CHOLHDL 3.1   Thyroid Function Tests: Recent Labs    02/08/19 0423  TSH 2.664   Anemia Panel: No results for input(s): VITAMINB12, FOLATE, FERRITIN, TIBC, IRON, RETICCTPCT in the last 72 hours.  Ct Head Wo Contrast  Result Date: 02/07/2019 CLINICAL DATA:  Seizure, new, nontraumatic, >40 yrs EXAM: CT HEAD WITHOUT CONTRAST TECHNIQUE: Contiguous axial images were obtained from the base of the skull through the vertex without intravenous contrast. COMPARISON:  None. FINDINGS: Brain: No intracranial hemorrhage, mass effect, or midline shift. No hydrocephalus. The basilar cisterns are patent. No evidence of territorial infarct or acute ischemia. No extra-axial or intracranial fluid collection. Vascular: No hyperdense vessel or unexpected calcification. Skull: No fracture or focal lesion. Sinuses/Orbits: Mild mucosal thickening of few ethmoid air cells. No sinus fluid levels. Mastoid air cells are clear. Included orbits are unremarkable. Other: None. IMPRESSION: No acute intracranial abnormality or explanation for seizure. Electronically Signed   By: Keith Rake M.D.   On: 02/07/2019 23:51   Mr Jeri Cos [id number] Contrast  Result Date: 02/08/2019 CLINICAL DATA:  Seizure,  new, nontraumatic. History of metastatic melanoma admitted with syncope. EXAM: MRI HEAD WITHOUT AND WITH CONTRAST TECHNIQUE: Multiplanar, multiecho pulse sequences of the brain and surrounding structures were obtained without and with intravenous contrast. CONTRAST:  7.40mL GADAVIST GADOBUTROL 1 MMOL/ML IV SOLN COMPARISON:  Head CT from yesterday FINDINGS: Brain: 3 mm lesion along the right caudate head and adjacent white matter with mild peripheral T1 shortening, no blood products by gradient imaging, and no detectable enhancement. Centrally this has a cystic appearance. No acute  infarct, hydrocephalus, or collection. The brain is normal appearing other than the small lesion. No atrophy or white matter disease Vascular: Major flow voids and vascular enhancements are preserved Skull and upper cervical spine: Negative for marrow lesion Sinuses/Orbits: Negative IMPRESSION: 1. 3 mm lesion at the right caudate nucleus which could be chronic lacune or a small metastatic focus. Recommend short follow-up brain MRI in 6-12 weeks. 2. No cortical finding to explain seizure. Electronically Signed   By: Monte Fantasia M.D.   On: 02/08/2019 11:43   US Carotid Bilateral  Result Date: 02/08/2019 CLINICAL DATA:  Stroke.  Syncope.  Hyperlipidemia. EXAM: BILATERAL CAROTID DUPLEX ULTRASOUND TECHNIQUE: Pearline Cables scale imaging, color Doppler and duplex ultrasound were performed of bilateral carotid and vertebral arteries in the neck. COMPARISON:  CT 02/09/2005 by report only FINDINGS: Criteria: Quantification of carotid stenosis is based on velocity parameters that correlate the residual internal carotid diameter with NASCET-based stenosis levels, using the diameter of the distal internal carotid lumen as the denominator for stenosis measurement. The following velocity measurements were obtained: RIGHT ICA: 115/33 cm/sec CCA: [id number] cm/sec SYSTOLIC ICA/CCA RATIO:  1.1 ECA: 164 cm/sec LEFT ICA: 128/39 cm/sec CCA: [id number] cm/sec SYSTOLIC ICA/CCA RATIO:  0.9 ECA: 143 cm/sec RIGHT CAROTID ARTERY: Mild smooth plaque in the bulb and at the ICA origin without high-grade stenosis. Normal waveforms and color Doppler signal. RIGHT VERTEBRAL ARTERY:  Normal flow direction and waveform. LEFT CAROTID ARTERY: Mild eccentric nonocclusive plaque in the bulb and ICA origin. Normal waveforms and color Doppler signal. No high-grade stenosis. LEFT VERTEBRAL ARTERY:  Normal flow direction and waveform. IMPRESSION: 1. Bilateral carotid bifurcation plaque resulting in less than 50% diameter ICA stenosis. 2. Antegrade bilateral vertebral  arterial flow. Electronically Signed   By: Lucrezia Europe M.D.   On: 02/08/2019 13:44   Dg Chest Portable 1 View  Result Date: 02/07/2019 CLINICAL DATA:  73 year old male with seizure like activity. Syncope. EXAM: PORTABLE CHEST 1 VIEW COMPARISON:  Chest radiograph dated 04/18/2011 FINDINGS: Focal area of density in the left mid lung field, likely atelectasis or interstitial crowding. No focal consolidation, pleural effusion, or pneumothorax. The cardiac silhouette is within normal limits. No acute osseous pathology. Atherosclerotic calcification of the aortic arch. IMPRESSION: No active disease. Electronically Signed   By: Anner Crete M.D.   On: 02/07/2019 23:55     Echo pending  TELEMETRY: Sinus rhythm:  ASSESSMENT AND PLAN:  Active Problems:   Syncope    1.  Syncope, without recurrence, no arrhythmias, likely vasovagal in nature 2.  Abnormal ECG, no chest pain or shortness of breath, normal high-sensitivity troponin 3.  Abnormal brain MRI with 3 mm lesion right caudate nucleus 4.  History of metastatic melanoma  Recommendations  1.  Agree with current therapy 2.  Review 2D echocardiogram 3.  Further recommendations pending 2D echocardiogram results 4.  Follow-up Dr. Nehemiah Massed after discharge   Isaias Cowman, MD, PhD, [facility] 02/09/2019 8:52 AM

## 2019-02-09 NOTE — Progress Notes (Signed)
Md made aware via text about pt refusing echo with bubble study. I will continue to assess.

## 2019-02-10 DIAGNOSIS — R937 Abnormal findings on diagnostic imaging of other parts of musculoskeletal system: Secondary | ICD-10-CM | POA: Diagnosis not present

## 2019-02-11 LAB — HEMOGLOBIN A1C
Hgb A1c MFr Bld: 5.5 % (ref 4.8–5.6)
Mean Plasma Glucose: 111 mg/dL

## 2019-02-20 DIAGNOSIS — I10 Essential (primary) hypertension: Secondary | ICD-10-CM | POA: Diagnosis not present

## 2019-02-20 DIAGNOSIS — I739 Peripheral vascular disease, unspecified: Secondary | ICD-10-CM | POA: Diagnosis not present

## 2019-02-20 DIAGNOSIS — R55 Syncope and collapse: Secondary | ICD-10-CM | POA: Diagnosis not present

## 2019-02-20 DIAGNOSIS — E782 Mixed hyperlipidemia: Secondary | ICD-10-CM | POA: Diagnosis not present

## 2019-02-21 DIAGNOSIS — C44329 Squamous cell carcinoma of skin of other parts of face: Secondary | ICD-10-CM | POA: Diagnosis not present

## 2019-02-25 ENCOUNTER — Ambulatory Visit (INDEPENDENT_AMBULATORY_CARE_PROVIDER_SITE_OTHER): Payer: 59

## 2019-02-25 DIAGNOSIS — Z23 Encounter for immunization: Secondary | ICD-10-CM

## 2019-04-03 ENCOUNTER — Other Ambulatory Visit: Payer: Self-pay | Admitting: Family Medicine

## 2019-04-03 DIAGNOSIS — N400 Enlarged prostate without lower urinary tract symptoms: Secondary | ICD-10-CM

## 2019-08-30 ENCOUNTER — Other Ambulatory Visit: Payer: Self-pay | Admitting: Family Medicine

## 2019-08-30 NOTE — Telephone Encounter (Signed)
Requested Prescriptions  Pending Prescriptions Disp Refills  . simvastatin (ZOCOR) 40 MG tablet [Pharmacy Med Name: SIMVASTATIN 40 MG TAB] 30 tablet 11    Sig: TAKE ONE TABLET BY MOUTH EVERY DAY     Cardiovascular:  Antilipid - Statins Passed - 08/30/2019 10:20 AM      Passed - Total Cholesterol in normal range and within 360 days    Cholesterol, Total  Date Value Ref Range Status  11/07/2018 205 (H) 100 - 199 mg/dL Final   Cholesterol  Date Value Ref Range Status  02/09/2019 162 0 - 200 mg/dL Final         Passed - LDL in normal range and within 360 days    LDL Calculated  Date Value Ref Range Status  11/07/2018 108 (H) 0 - 99 mg/dL Final   LDL Cholesterol  Date Value Ref Range Status  02/09/2019 85 0 - 99 mg/dL Final    Comment:           Total Cholesterol/HDL:CHD Risk Coronary Heart Disease Risk Table                     Men   Women  1/2 Average Risk   3.4   3.3  Average Risk       5.0   4.4  2 X Average Risk   9.6   7.1  3 X Average Risk  23.4   11.0        Use the calculated Patient Ratio above and the CHD Risk Table to determine the patient's CHD Risk.        ATP III CLASSIFICATION (LDL):  <100     mg/dL   Optimal  100-129  mg/dL   Near or Above                    Optimal  130-159  mg/dL   Borderline  160-189  mg/dL   High  >190     mg/dL   Very High Performed at [facility], [place], [place] [address]          Passed - HDL in normal range and within 360 days    HDL  Date Value Ref Range Status  02/09/2019 53 >40 mg/dL Final  11/07/2018 64 >39 mg/dL Final         Passed - Triglycerides in normal range and within 360 days    Triglycerides  Date Value Ref Range Status  02/09/2019 118 <150 mg/dL Final         Passed - Patient is not pregnant      Passed - Valid encounter within last 12 months    Recent Outpatient Visits          10 months ago Encounter for annual physical exam   [facility] Jerrol Banana., MD   2 years ago Annual physical exam   [facility] Jerrol Banana., MD   2 years ago Hospital discharge follow-up   [facility] Jerrol Banana., MD   3 years ago Annual physical exam   [facility] Jerrol Banana., MD   3 years ago HLD (hyperlipidemia)   [facility] Jerrol Banana., MD      Future Appointments            In 1 month Jerrol Banana., MD [facility], [place]

## 2019-10-09 ENCOUNTER — Encounter: Payer: Self-pay | Admitting: Family Medicine

## 2019-10-13 NOTE — Progress Notes (Signed)
Trena Platt Cummings,acting as a scribe for Wilhemena Durie, MD.,have documented all relevant documentation on the behalf of Wilhemena Durie, MD,as directed by  Wilhemena Durie, MD while in the presence of Wilhemena Durie, MD.  Annual Wellness Visit      Patient: Marc Munoz, Male    DOB: 12-18-1945, 74 y.o.   MRN: [id number] Visit Date: 10/16/2019  Today's Provider: Wilhemena Durie, MD  Subjective:    Chief Complaint  Patient presents with  . Annual Exam  . Hyperlipidemia   Marc Munoz is a 74 y.o. male who presents today for his Annual Wellness Visit.  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  His wife and his son are doing well.  Patient is recovering from malignant melanoma metastatic to his heel.  He is back to playing competitive tennis.  Overall he feels well.      Medications: Outpatient Medications Prior to Visit  Medication Sig  . aspirin EC 81 MG tablet Take 81 mg by mouth daily.  . calcium-vitamin D (CALCIUM 500/D) 500-200 MG-UNIT tablet Take 1 tablet by mouth daily.  . Cholecalciferol (VITAMIN D3) 10 MCG (400 UNIT) CAPS Take 1 capsule by mouth daily.  Marland Kitchen denosumab (PROLIA) 60 MG/ML SOSY injection Inject 60 mg into the skin every 6 (six) months.  . Multiple Vitamins tablet Take 1 tablet by mouth daily.   . nivolumab 480 mg in sodium chloride 0.9 % 100 mL Inject 480 mg into the vein.  . Nutritional Supplements (IMMUNE ENHANCE) TABS Take 1 tablet by mouth daily.   . simvastatin (ZOCOR) 40 MG tablet TAKE ONE TABLET BY MOUTH EVERY DAY  . tamsulosin (FLOMAX) 0.4 MG CAPS capsule TAKE 1 CAPSULE EVERY DAY  . Vitamin D, Ergocalciferol, (DRISDOL) 1.25 MG (50000 UT) CAPS capsule TAKE 1 CAPSULE EVERY WEEK (Patient not taking: Reported on 02/08/2019)   No facility-administered medications prior to visit.    No Known Allergies  Patient Care Team: Jerrol Banana., MD as PCP - General (Family  Medicine)  Review of Systems  Constitutional: Negative.   HENT: Negative.   Eyes: Negative.   Respiratory: Negative.   Cardiovascular: Negative.   Gastrointestinal: Negative.   Endocrine: Negative.   Genitourinary: Negative.   Musculoskeletal: Negative.   Skin: Negative.   Allergic/Immunologic: Negative.   Neurological: Negative.   Hematological: Negative.   Psychiatric/Behavioral: Negative.   All other systems reviewed and are negative.       Objective:    Vitals: There were no vitals taken for this visit. Wt Readings from Last 3 Encounters:  10/16/19 155 lb 3.2 oz (70.4 kg)  02/09/19 159 lb 14.4 oz (72.5 kg)  10/09/18 166 lb 9.6 oz (75.6 kg)      Physical Exam Vitals reviewed.  Constitutional:      Appearance: Normal appearance. He is well-developed.  HENT:     Head: Normocephalic and atraumatic.     Right Ear: Tympanic membrane and external ear normal.     Left Ear: Tympanic membrane and external ear normal.     Nose: Nose normal.     Mouth/Throat:     Pharynx: Oropharynx is clear.  Eyes:     General: No scleral icterus.    Conjunctiva/sclera: Conjunctivae normal.     Pupils: Pupils are equal, round, and reactive to light.  Neck:     Thyroid: No thyromegaly.  Cardiovascular:  Rate and Rhythm: Normal rate and regular rhythm.     Heart sounds: Normal heart sounds.  Pulmonary:     Effort: Pulmonary effort is normal.     Breath sounds: Normal breath sounds.  Abdominal:     Palpations: Abdomen is soft.  Genitourinary:    Penis: Normal.      Prostate: Normal.     Rectum: Normal.  Musculoskeletal:        General: Normal range of motion.  Lymphadenopathy:     Cervical: No cervical adenopathy.  Skin:    General: Skin is warm and dry.     Comments: Fair skin.  Neurological:     General: No focal deficit present.     Mental Status: He is alert and oriented to person, place, and time. Mental status is at baseline.  Psychiatric:        Mood and Affect:  Mood normal.        Behavior: Behavior normal.        Thought Content: Thought content normal.        Judgment: Judgment normal.      Most recent functional status assessment: In your present state of health, do you have any difficulty performing the following activities: 02/08/2019  Hearing? N  Vision? N  Difficulty concentrating or making decisions? N  Walking or climbing stairs? N  Dressing or bathing? N  Doing errands, shopping? N  Some recent data might be hidden    Most recent fall risk assessment: Fall Risk  07/25/2017  Falls in the past year? No     Most recent depression screenings: PHQ 2/9 Scores 10/09/2018 07/25/2017  PHQ - 2 Score 0 0  PHQ- 9 Score - 0    Most recent cognitive screening: 6CIT Screen 07/19/2016  What Year? 0 points  What month? 0 points  What time? 0 points  Count back from 20 0 points  Months in reverse 0 points  Repeat phrase 2 points  Total Score 2    No results found for any visits on 10/16/19.     Assessment & Plan:      Annual wellness visit done today including the all of the following: Reviewed patient's Family Medical History Reviewed and updated list of patient's medical providers Assessment of cognitive impairment was done Assessed patient's functional ability Established a written schedule for health screening [place] Completed and Reviewed  Exercise Activities and Dietary recommendations Goals   None     Immunization History  Administered Date(s) Administered  . Fluad Quad(high Dose 65+) 02/25/2019  . Influenza, High Dose Seasonal PF 03/11/2016  . Pneumococcal Conjugate-13 07/08/2014  . Pneumococcal Polysaccharide-23 06/11/2012  . Tdap 06/11/2012  . Zoster 06/11/2012    Health Maintenance  Topic Date Due  . INFLUENZA VACCINE  12/28/2019  . TETANUS/TDAP  06/11/2022  . COLONOSCOPY  05/02/2025  . COVID-19 Vaccine  Completed  . Hepatitis C Screening  Completed  . PNA vac Low Risk Adult   Completed     Discussed health benefits of physical activity, and encouraged him to engage in regular exercise appropriate for his age and condition.    1. Pure hypercholesterolemia On Zocor.  2. Encounter for screening fecal occult blood testing OC negative.  Time for repeat colonoscopy with pathology of sessile serrated polyp on last colonoscopy - IFOBT POC (occult bld, rslt in office); Future - IFOBT POC (occult bld, rslt in office)  3. Melanoma of skin (HCC)/history of Merkel cell carcinoma History of  melanoma metastatic to the heel.  Followed by dermatology and oncology.  [place], MD  [facility] [phone] (phone) [phone] (fax)  [place]

## 2019-10-16 ENCOUNTER — Other Ambulatory Visit: Payer: Self-pay

## 2019-10-16 ENCOUNTER — Encounter: Payer: Self-pay | Admitting: Family Medicine

## 2019-10-16 ENCOUNTER — Ambulatory Visit (INDEPENDENT_AMBULATORY_CARE_PROVIDER_SITE_OTHER): Payer: Medicare Other | Admitting: Family Medicine

## 2019-10-16 VITALS — BP 138/74 | HR 59 | Temp 97.1°F | Ht 72.0 in | Wt 155.2 lb

## 2019-10-16 DIAGNOSIS — C439 Malignant melanoma of skin, unspecified: Secondary | ICD-10-CM | POA: Diagnosis not present

## 2019-10-16 DIAGNOSIS — E559 Vitamin D deficiency, unspecified: Secondary | ICD-10-CM | POA: Diagnosis not present

## 2019-10-16 DIAGNOSIS — E7800 Pure hypercholesterolemia, unspecified: Secondary | ICD-10-CM | POA: Diagnosis not present

## 2019-10-16 DIAGNOSIS — Z1211 Encounter for screening for malignant neoplasm of colon: Secondary | ICD-10-CM | POA: Diagnosis not present

## 2019-10-16 LAB — IFOBT (OCCULT BLOOD): IFOBT: NEGATIVE

## 2019-11-18 ENCOUNTER — Telehealth: Payer: Self-pay

## 2019-11-18 NOTE — Telephone Encounter (Signed)
Copied from [place] [id number]. Topic: General - Other >> Nov 18, 2019  8:28 AM Keene Breath wrote: Reason for CRM:  Patient's wife called to speak with the nurse regarding a bill he received with the incorrect insurance information.  She stated that she had given the correct insurance info. At his last visit, but it was not recorded because they received a huge bill.  Patient called the billing dept. And they told her to cal his PCP.  Please advise and call back to discuss at [phone]

## 2019-11-20 NOTE — Telephone Encounter (Signed)
Spouse would like a follow up call today best # [phone] if unavailable please leave a detail message.   Insurance states [facility] did not file secondary insurance Plan G Medicare the date of service is May 20, 438  policy # [id number] medicare supplement plan G, policy effective 10/154 - [employer].

## 2019-11-20 NOTE — Telephone Encounter (Signed)
Copied from [place] [phone]. Topic: General - Other >> Nov 20, 2019 12:26 PM Leward Quan A wrote: Reason for CRM: Patient called to speak to Arbie Cookey states that she called him in regards to some insurance information. He can be reached at Ph# [phone]

## 2019-11-21 LAB — HM COLONOSCOPY

## 2019-12-04 ENCOUNTER — Encounter: Payer: Self-pay | Admitting: Family Medicine

## 2020-02-05 ENCOUNTER — Other Ambulatory Visit: Payer: Self-pay

## 2020-02-05 ENCOUNTER — Ambulatory Visit (INDEPENDENT_AMBULATORY_CARE_PROVIDER_SITE_OTHER): Payer: Medicare Other

## 2020-02-05 DIAGNOSIS — Z23 Encounter for immunization: Secondary | ICD-10-CM

## 2020-03-08 ENCOUNTER — Ambulatory Visit: Payer: Medicare Other | Attending: Internal Medicine

## 2020-03-08 DIAGNOSIS — Z23 Encounter for immunization: Secondary | ICD-10-CM

## 2020-03-08 NOTE — Progress Notes (Signed)
   Covid-19 Vaccination Clinic  Name:  Marc Munoz    MRN: [id number] DOB: 1945-11-24  03/08/2020  [name] was observed post Covid-19 immunization for 15 minutes without incident. He was provided with Vaccine Information Sheet and instruction to access the V-Safe system.   [name] was instructed to call 911 with any severe reactions post vaccine: Marland Kitchen Difficulty breathing  . Swelling of face and throat  . A fast heartbeat  . A bad rash all over body  . Dizziness and weakness

## 2020-03-27 ENCOUNTER — Other Ambulatory Visit: Payer: Self-pay | Admitting: Family Medicine

## 2020-03-27 DIAGNOSIS — N400 Enlarged prostate without lower urinary tract symptoms: Secondary | ICD-10-CM

## 2020-03-27 NOTE — Telephone Encounter (Signed)
Requested Prescriptions  Pending Prescriptions Disp Refills  . tamsulosin (FLOMAX) 0.4 MG CAPS capsule [Pharmacy Med Name: TAMSULOSIN HCL 0.4 MG CAP] 90 capsule 3    Sig: TAKE [place]     Urology: Alpha-Adrenergic Blocker Passed - 03/27/2020  9:42 AM      Passed - Last BP in normal range    BP Readings from Last 1 Encounters:  10/16/19 138/74         Passed - Valid encounter within last 12 months    Recent Outpatient Visits          5 months ago Pure hypercholesterolemia   [facility] Jerrol Banana., MD   1 year ago Encounter for annual physical exam   [facility] Jerrol Banana., MD   2 years ago Annual physical exam   [facility] Jerrol Banana., MD   3 years ago Hospital discharge follow-up   [facility] Jerrol Banana., MD   3 years ago Annual physical exam   [facility] Jerrol Banana., MD      Future Appointments            In 3 weeks Jerrol Banana., MD [facility], [place]

## 2020-04-19 ENCOUNTER — Ambulatory Visit: Payer: Self-pay | Admitting: Family Medicine

## 2020-04-30 NOTE — Progress Notes (Signed)
Established patient visit   Patient: Marc Munoz   DOB: [birth year]   74 y.o. Male  MRN: [id number] Visit Date: 05/03/2020  Today's healthcare provider: Wilhemena Durie, MD   Chief Complaint  Patient presents with  . Hyperlipidemia  . Hoarse   Subjective    HPI  Patient is feeling well and is doing exceedingly well.  He 1 [employer] and in the [employer] which was 9 different states. He has scans scheduled for next week for follow-up of his metastatic cancer.  He is doing well. He has 1 complaint today and that is 1 of a hoarse voice for the past month.  He does not feel bad and has no cough no weight loss or any systemic symptoms.  He does have some mild chronic BPH symptoms. Lipid/Cholesterol, follow-up  Last Lipid Panel: Lab Results  Component Value Date   CHOL 162 02/09/2019   LDLCALC 85 02/09/2019   HDL 53 02/09/2019   TRIG 118 02/09/2019    He was last seen for this 7 months ago.  Management since that visit includes; on Zocor.  He reports good compliance with treatment. He is not having side effects.  He is following a Regular diet. Current exercise: walking  Last metabolic panel Lab Results  Component Value Date   GLUCOSE 104 (H) 02/09/2019   NA 141 02/09/2019   K 3.9 02/09/2019   BUN 15 02/09/2019   CREATININE 0.95 02/09/2019   GFRNONAA >60 02/09/2019   GFRAA >60 02/09/2019   CALCIUM 8.4 (L) 02/09/2019   AST 21 02/07/2019   ALT 15 02/07/2019   The ASCVD Risk score (Goff DC Jr., et al., 2013) failed to calculate for the following reasons:   The patient has a prior MI or stroke diagnosis   Patient also mentions that he has been hoarse for the last month. Denies any GERD symptoms. Denies sore throat or PND. He feels that his cancer meds may have contributed to this.       Medications: Outpatient Medications Prior to Visit  Medication Sig  . aspirin EC 81 MG tablet Take 81 mg by mouth daily.  .  calcium-vitamin D (CALCIUM 500/D) 500-200 MG-UNIT tablet Take 1 tablet by mouth daily.  . Cholecalciferol (VITAMIN D3) 10 MCG (400 UNIT) CAPS Take 1 capsule by mouth daily.  Marland Kitchen denosumab (PROLIA) 60 MG/ML SOSY injection Inject 60 mg into the skin every 6 (six) months.  . Multiple Vitamins tablet Take 1 tablet by mouth daily.   . nivolumab 480 mg in sodium chloride 0.9 % 100 mL Inject 480 mg into the vein.  . Nutritional Supplements (IMMUNE ENHANCE) TABS Take 1 tablet by mouth daily.   . simvastatin (ZOCOR) 40 MG tablet TAKE ONE TABLET BY MOUTH EVERY DAY  . tamsulosin (FLOMAX) 0.4 MG CAPS capsule TAKE 1 CAPSULE EVERY DAY  . Vitamin D, Ergocalciferol, (DRISDOL) 1.25 MG (50000 UT) CAPS capsule TAKE 1 CAPSULE EVERY WEEK (Patient not taking: Reported on 02/08/2019)   No facility-administered medications prior to visit.    Review of Systems  Constitutional: Negative for appetite change, chills and fever.  Respiratory: Negative for chest tightness, shortness of breath and wheezing.   Cardiovascular: Negative for chest pain and palpitations.  Gastrointestinal: Negative for abdominal pain, nausea and vomiting.       Objective    BP 129/70   Pulse (!) 58   Temp 98 F (36.7 C)   Resp 20  Ht 6' (1.829 m)   Wt 164 lb (74.4 kg)   BMI 22.24 kg/m  Wt Readings from Last 3 Encounters:  05/03/20 164 lb (74.4 kg)  10/16/19 155 lb 3.2 oz (70.4 kg)  02/09/19 159 lb 14.4 oz (72.5 kg)      Physical Exam Vitals reviewed.  Constitutional:      Appearance: Normal appearance. He is well-developed.  HENT:     Head: Normocephalic and atraumatic.     Right Ear: Tympanic membrane and external ear normal.     Left Ear: Tympanic membrane and external ear normal.     Nose: Nose normal.     Mouth/Throat:     Pharynx: Oropharynx is clear.  Eyes:     General: No scleral icterus.    Conjunctiva/sclera: Conjunctivae normal.     Pupils: Pupils are equal, round, and reactive to light.  Neck:     Thyroid:  No thyromegaly.  Cardiovascular:     Rate and Rhythm: Normal rate and regular rhythm.     Heart sounds: Normal heart sounds.  Pulmonary:     Effort: Pulmonary effort is normal.     Breath sounds: Normal breath sounds.  Abdominal:     Palpations: Abdomen is soft.  Genitourinary:    Penis: Normal.      Prostate: Normal.     Rectum: Normal.  Musculoskeletal:        General: Normal range of motion.  Lymphadenopathy:     Cervical: No cervical adenopathy.  Skin:    General: Skin is warm and dry.     Comments: Fair skin.  Neurological:     General: No focal deficit present.     Mental Status: He is alert and oriented to person, place, and time. Mental status is at baseline.  Psychiatric:        Mood and Affect: Mood normal.        Behavior: Behavior normal.        Thought Content: Thought content normal.        Judgment: Judgment normal.       No results found for any visits on 05/03/20.  Assessment & Plan     1. Pure hypercholesterolemia On simvastatin 40 - CBC w/Diff/Platelet - Comprehensive Metabolic Panel (CMET) - TSH - Lipid panel - PSA  2. Benign prostatic hyperplasia, unspecified whether lower urinary tract symptoms present Check PSA - CBC w/Diff/Platelet - Comprehensive Metabolic Panel (CMET) - TSH - Lipid panel - PSA  3. Hoarseness Treat with omeprazole and then 5 days of prednisone.  When refer to ENT as patient has no reflux symptoms and really no allergy symptoms.  - predniSONE (DELTASONE) 20 MG tablet; Take 1 tablet (20 mg total) by mouth daily with breakfast.  Dispense: 5 tablet; Refill: 0 - omeprazole (PRILOSEC) 20 MG capsule; Take 1 capsule (20 mg total) by mouth every morning.  Dispense: 30 capsule; Refill: 3 - Ambulatory referral to ENT 4.  Malignant melanoma metastatic to the lung and bone By oncology and by dermatology Return in about 5 months (around 10/01/2020).        Herschell Virani Cranford Mon, MD  [facility] [phone]  (phone) [phone] (fax)  [place]

## 2020-05-03 ENCOUNTER — Encounter: Payer: Self-pay | Admitting: Family Medicine

## 2020-05-03 ENCOUNTER — Ambulatory Visit (INDEPENDENT_AMBULATORY_CARE_PROVIDER_SITE_OTHER): Payer: Medicare Other | Admitting: Family Medicine

## 2020-05-03 ENCOUNTER — Other Ambulatory Visit: Payer: Self-pay

## 2020-05-03 VITALS — BP 129/70 | HR 58 | Temp 98.0°F | Resp 20 | Ht 72.0 in | Wt 164.0 lb

## 2020-05-03 DIAGNOSIS — R490 Dysphonia: Secondary | ICD-10-CM

## 2020-05-03 DIAGNOSIS — E7800 Pure hypercholesterolemia, unspecified: Secondary | ICD-10-CM | POA: Diagnosis not present

## 2020-05-03 DIAGNOSIS — C4A9 Merkel cell carcinoma, unspecified: Secondary | ICD-10-CM | POA: Diagnosis not present

## 2020-05-03 DIAGNOSIS — N400 Enlarged prostate without lower urinary tract symptoms: Secondary | ICD-10-CM

## 2020-05-03 DIAGNOSIS — C7951 Secondary malignant neoplasm of bone: Secondary | ICD-10-CM

## 2020-05-03 MED ORDER — PREDNISONE 20 MG PO TABS: 20.0000 mg | ORAL_TABLET | Freq: Every day | ORAL | 0 refills | Status: DC

## 2020-05-03 MED ORDER — OMEPRAZOLE 20 MG PO CPDR: 20.0000 mg | DELAYED_RELEASE_CAPSULE | ORAL | 3 refills | Status: DC

## 2020-05-06 LAB — COMPREHENSIVE METABOLIC PANEL
ALT: 19 IU/L (ref 0–44)
AST: 25 IU/L (ref 0–40)
Albumin/Globulin Ratio: 1.5 (ref 1.2–2.2)
Albumin: 4.5 g/dL (ref 3.7–4.7)
Alkaline Phosphatase: 52 IU/L (ref 44–121)
BUN/Creatinine Ratio: 18 (ref 10–24)
BUN: 23 mg/dL (ref 8–27)
Bilirubin Total: 0.4 mg/dL (ref 0.0–1.2)
CO2: 26 mmol/L (ref 20–29)
Calcium: 9.6 mg/dL (ref 8.6–10.2)
Chloride: 99 mmol/L (ref 96–106)
Creatinine, Ser: 1.27 mg/dL (ref 0.76–1.27)
GFR calc Af Amer: 64 mL/min/{1.73_m2} (ref >59)
GFR calc non Af Amer: 56 mL/min/{1.73_m2} — ABNORMAL LOW (ref >59)
Globulin, Total: 3 g/dL (ref 1.5–4.5)
Glucose: 101 mg/dL — ABNORMAL HIGH (ref 65–99)
Potassium: 4.6 mmol/L (ref 3.5–5.2)
Sodium: 139 mmol/L (ref 134–144)
Total Protein: 7.5 g/dL (ref 6.0–8.5)

## 2020-05-06 LAB — CBC WITH DIFFERENTIAL/PLATELET
Basophils Absolute: 0 10*3/uL (ref 0.0–0.2)
Basos: 1 %
EOS (ABSOLUTE): 0.1 10*3/uL (ref 0.0–0.4)
Eos: 1 %
Hematocrit: 43.1 % (ref 37.5–51.0)
Hemoglobin: 14.6 g/dL (ref 13.0–17.7)
Immature Grans (Abs): 0 10*3/uL (ref 0.0–0.1)
Immature Granulocytes: 0 %
Lymphocytes Absolute: 1.4 10*3/uL (ref 0.7–3.1)
Lymphs: 30 %
MCH: 30.7 pg (ref 26.6–33.0)
MCHC: 33.9 g/dL (ref 31.5–35.7)
MCV: 91 fL (ref 79–97)
Monocytes Absolute: 0.4 10*3/uL (ref 0.1–0.9)
Monocytes: 9 %
Neutrophils Absolute: 2.9 10*3/uL (ref 1.4–7.0)
Neutrophils: 59 %
Platelets: 221 10*3/uL (ref 150–450)
RBC: 4.75 x10E6/uL (ref 4.14–5.80)
RDW: 11.9 % (ref 11.6–15.4)
WBC: 4.9 10*3/uL (ref 3.4–10.8)

## 2020-05-06 LAB — TSH: TSH: 3.26 u[IU]/mL (ref 0.450–4.500)

## 2020-05-06 LAB — LIPID PANEL
Chol/HDL Ratio: 2.7 ratio (ref 0.0–5.0)
Cholesterol, Total: 218 mg/dL — ABNORMAL HIGH (ref 100–199)
HDL: 82 mg/dL (ref >39)
LDL Chol Calc (NIH): 115 mg/dL — ABNORMAL HIGH (ref 0–99)
Triglycerides: 124 mg/dL (ref 0–149)
VLDL Cholesterol Cal: 21 mg/dL (ref 5–40)

## 2020-05-06 LAB — PSA: Prostate Specific Ag, Serum: 4 ng/mL (ref 0.0–4.0)

## 2020-08-21 ENCOUNTER — Other Ambulatory Visit: Payer: Self-pay | Admitting: Family Medicine

## 2020-08-21 NOTE — Telephone Encounter (Signed)
Approved per protocol.  Requested Prescriptions  Pending Prescriptions Disp Refills  . simvastatin (ZOCOR) 40 MG tablet [Pharmacy Med Name: SIMVASTATIN 40 MG TAB] 30 tablet 2    Sig: TAKE ONE TABLET BY MOUTH EVERY DAY     Cardiovascular:  Antilipid - Statins Failed - 08/21/2020  7:56 AM      Failed - Total Cholesterol in normal range and within 360 days    Cholesterol, Total  Date Value Ref Range Status  05/05/2020 218 (H) 100 - 199 mg/dL Final         Failed - LDL in normal range and within 360 days    LDL Chol Calc (NIH)  Date Value Ref Range Status  05/05/2020 115 (H) 0 - 99 mg/dL Final         Passed - HDL in normal range and within 360 days    HDL  Date Value Ref Range Status  05/05/2020 82 >39 mg/dL Final         Passed - Triglycerides in normal range and within 360 days    Triglycerides  Date Value Ref Range Status  05/05/2020 124 0 - 149 mg/dL Final         Passed - Patient is not pregnant      Passed - Valid encounter within last 12 months    Recent Outpatient Visits          3 months ago Pure hypercholesterolemia   [facility] Jerrol Banana., MD   10 months ago Pure hypercholesterolemia   [facility] Jerrol Banana., MD   1 year ago Encounter for annual physical exam   [facility] Jerrol Banana., MD   3 years ago Annual physical exam   [facility] Jerrol Banana., MD   3 years ago Hospital discharge follow-up   [facility] Jerrol Banana., MD

## 2020-09-02 ENCOUNTER — Ambulatory Visit: Payer: Medicare Other | Attending: Internal Medicine

## 2020-09-02 DIAGNOSIS — Z23 Encounter for immunization: Secondary | ICD-10-CM

## 2020-09-02 NOTE — Progress Notes (Signed)
   Covid-19 Vaccination Clinic  Name:  Marc Munoz    MRN: [id number] DOB: 1946-05-14  09/02/2020  Mr. Marc Munoz was observed post Covid-19 immunization for 15 minutes without incident. He was provided with Vaccine Information Sheet and instruction to access the V-Safe system.   Mr. Marc Munoz was instructed to call 911 with any severe reactions post vaccine: Marland Kitchen Difficulty breathing  . Swelling of face and throat  . A fast heartbeat  . A bad rash all over body  . Dizziness and weakness   Immunizations Administered    Name Date Dose VIS Date Route   PFIZER Comrnaty(Gray TOP) Covid-19 Vaccine 09/02/2020  1:20 PM 0.3 mL 05/06/2020 Intramuscular   Manufacturer: [employer], [employer]   Lot: [id number]   [place]: [phone]

## 2020-09-02 NOTE — Progress Notes (Signed)
   Covid-19 Vaccination Clinic  Name:  Marc Munoz    MRN: [id number] DOB: March 03, 1946  09/02/2020  [name] was observed post Covid-19 immunization for 15 minutes without incident. He was provided with Vaccine Information Sheet and instruction to access the V-Safe system.   [name] was instructed to call 911 with any severe reactions post vaccine: Marland Kitchen Difficulty breathing  . Swelling of face and throat  . A fast heartbeat  . A bad rash all over body  . Dizziness and weakness   Immunizations Administered    Name Date Dose VIS Date Route   PFIZER Comrnaty(Gray TOP) Covid-19 Vaccine 09/02/2020  1:20 PM 0.3 mL 05/06/2020 Intramuscular   Manufacturer: [employer], [employer]   Lot: [id number]   [place]: [phone]

## 2020-09-06 ENCOUNTER — Other Ambulatory Visit: Payer: Self-pay

## 2020-09-06 MED ORDER — PFIZER-BIONT COVID-19 VAC-TRIS 30 MCG/0.3ML IM SUSP
INTRAMUSCULAR | 0 refills | Status: DC
  Filled 2020-09-06: qty 0.3, 20d supply, fill #0

## 2020-10-05 ENCOUNTER — Telehealth: Payer: Self-pay

## 2020-10-05 NOTE — Telephone Encounter (Signed)
Copied from [place] [id number]. Topic: General - Other >> Oct 05, 2020  2:27 PM Celene Kras wrote: Reason for CRM: Pt called stating that he received a call from the office. He states that there was no VM and no documentation on chart. Please advise.

## 2020-10-05 NOTE — Telephone Encounter (Signed)
There is no documentation that someone from this tried to call patient.

## 2020-10-18 ENCOUNTER — Ambulatory Visit (INDEPENDENT_AMBULATORY_CARE_PROVIDER_SITE_OTHER): Payer: Medicare Other | Admitting: Family Medicine

## 2020-10-18 ENCOUNTER — Encounter: Payer: Self-pay | Admitting: Family Medicine

## 2020-10-18 ENCOUNTER — Other Ambulatory Visit: Payer: Self-pay

## 2020-10-18 VITALS — BP 135/73 | HR 59 | Temp 98.0°F | Resp 16 | Ht 72.0 in | Wt 162.0 lb

## 2020-10-18 DIAGNOSIS — E7800 Pure hypercholesterolemia, unspecified: Secondary | ICD-10-CM | POA: Diagnosis not present

## 2020-10-18 DIAGNOSIS — N400 Enlarged prostate without lower urinary tract symptoms: Secondary | ICD-10-CM

## 2020-10-18 DIAGNOSIS — K219 Gastro-esophageal reflux disease without esophagitis: Secondary | ICD-10-CM

## 2020-10-18 DIAGNOSIS — C7951 Secondary malignant neoplasm of bone: Secondary | ICD-10-CM | POA: Diagnosis not present

## 2020-10-18 DIAGNOSIS — Z0000 Encounter for general adult medical examination without abnormal findings: Secondary | ICD-10-CM

## 2020-10-18 NOTE — Progress Notes (Signed)
I,Marc Munoz,acting as a scribe for Marc Durie, MD.,have documented all relevant documentation on the behalf of Marc Durie, MD,as directed by  Marc Durie, MD while in the presence of Marc Durie, MD.   Annual Wellness Visit     Patient: Marc Munoz, Male    DOB: [birth year], 75 y.o.   MRN: [id number] Visit Date: 10/18/2020  Today's Provider: Wilhemena Durie, MD   Chief Complaint  Patient presents with  . Medicare Wellness   Subjective    Marc Munoz is a 75 y.o. male who presents today for his Annual Wellness Visit. He reports consuming a organic diet diet. Home exercise routine includes Tennis 5x per week. He generally feels well. He reports sleeping fairly well. He does not have additional problems to discuss today.   HPI  Overall he is doing well.  He has had hoarseness for the past year and has been evaluated by ENT and was told he has silent GERD.       Medications: Outpatient Medications Prior to Visit  Medication Sig  . aspirin EC 81 MG tablet Take 81 mg by mouth daily.  . calcium-vitamin D (CALCIUM 500/D) 500-200 MG-UNIT tablet Take 1 tablet by mouth daily.  . Cholecalciferol (VITAMIN D3) 10 MCG (400 UNIT) CAPS Take 1 capsule by mouth daily.  Marland Kitchen COVID-19 mRNA Vac-TriS, Pfizer, (PFIZER-BIONT COVID-19 VAC-TRIS) SUSP injection Inject into the muscle.  . Multiple Vitamins tablet Take 1 tablet by mouth daily.   . Nutritional Supplements (IMMUNE ENHANCE) TABS Take 1 tablet by mouth daily.   Marland Kitchen omeprazole (PRILOSEC) 20 MG capsule Take 1 capsule (20 mg total) by mouth every morning.  . simvastatin (ZOCOR) 40 MG tablet TAKE ONE TABLET BY MOUTH EVERY DAY  . tamsulosin (FLOMAX) 0.4 MG CAPS capsule TAKE 1 CAPSULE EVERY DAY  . denosumab (PROLIA) 60 MG/ML SOSY injection Inject 60 mg into the skin every 6 (six) months. (Patient not taking: Reported on 10/18/2020)  . nivolumab 480 mg in sodium chloride 0.9 % 100 mL Inject 480 mg into the vein.  (Patient not taking: Reported on 10/18/2020)  . predniSONE (DELTASONE) 20 MG tablet Take 1 tablet (20 mg total) by mouth daily with breakfast. (Patient not taking: Reported on 10/18/2020)  . Vitamin D, Ergocalciferol, (DRISDOL) 1.25 MG (50000 UT) CAPS capsule TAKE 1 CAPSULE EVERY WEEK (Patient not taking: No sig reported)   No facility-administered medications prior to visit.    No Known Allergies  Patient Care Team: Jerrol Banana., MD as PCP - General (Family Medicine)  Review of Systems  All other systems reviewed and are negative.        Objective    Vitals: BP 135/73 (BP Location: Left Arm, Patient Position: Sitting, Cuff Size: Normal)   Pulse (!) 59   Temp 98 F (36.7 C) (Oral)   Resp 16   Ht 6' (1.829 m)   Wt 162 lb (73.5 kg)   SpO2 (!) 72%   BMI 21.97 kg/m  BP Readings from Last 3 Encounters:  10/18/20 135/73  05/03/20 129/70  10/16/19 138/74   Wt Readings from Last 3 Encounters:  10/18/20 162 lb (73.5 kg)  05/03/20 164 lb (74.4 kg)  10/16/19 155 lb 3.2 oz (70.4 kg)      Physical Exam Vitals reviewed.  Constitutional:      Appearance: He is well-developed.  HENT:     Head: Normocephalic and atraumatic.     Right Ear: External ear  normal.     Left Ear: External ear normal.     Nose: Nose normal.  Eyes:     General: No scleral icterus.    Conjunctiva/sclera: Conjunctivae normal.     Pupils: Pupils are equal, round, and reactive to light.  Neck:     Thyroid: No thyromegaly.  Cardiovascular:     Rate and Rhythm: Normal rate and regular rhythm.     Heart sounds: Normal heart sounds.  Pulmonary:     Effort: Pulmonary effort is normal.     Breath sounds: Normal breath sounds.  Abdominal:     Palpations: Abdomen is soft.  Genitourinary:    Penis: Normal.      Testes: Normal.  Musculoskeletal:        General: Normal range of motion.  Lymphadenopathy:     Cervical: No cervical adenopathy.  Skin:    General: Skin is warm and dry.      Comments: Fair skin.  Neurological:     General: No focal deficit present.     Mental Status: He is alert and oriented to person, place, and time.  Psychiatric:        Mood and Affect: Mood normal.        Behavior: Behavior normal.        Thought Content: Thought content normal.        Judgment: Judgment normal.      Most recent functional status assessment: In your present state of health, do you have any difficulty performing the following activities: 10/18/2020  Hearing? Y  Vision? N  Difficulty concentrating or making decisions? N  Walking or climbing stairs? N  Dressing or bathing? N  Doing errands, shopping? N  Some recent data might be hidden   Most recent fall risk assessment: Fall Risk  10/18/2020  Falls in the past year? 0  Number falls in past yr: 0  Injury with Fall? 0  Follow up Falls evaluation completed    Most recent depression screenings: PHQ 2/9 Scores 10/18/2020 05/03/2020  PHQ - 2 Score 0 0  PHQ- 9 Score 0 0   Most recent cognitive screening: 6CIT Screen 10/16/2019  What Year? 0 points  What month? 0 points  What time? 0 points  Count back from 20 0 points  Months in reverse 0 points  Repeat phrase 6 points  Total Score 6   Most recent Audit-C alcohol use screening Alcohol Use Disorder Test (AUDIT) 10/18/2020  1. How often do you have a drink containing alcohol? 1  2. How many drinks containing alcohol do you have on a typical day when you are drinking? 0  3. How often do you have six or more drinks on one occasion? 0  AUDIT-C Score 1  Alcohol Brief Interventions/Follow-up -   A score of 3 or more in women, and 4 or more in men indicates increased risk for alcohol abuse, EXCEPT if all of the points are from question 1   No results found for any visits on 10/18/20.  Assessment & Plan     Annual wellness visit done today including the all of the following: Reviewed patient's Family Medical History Reviewed and updated list of patient's medical  providers Assessment of cognitive impairment was done Assessed patient's functional ability Established a written schedule for health screening [place] Completed and Reviewed  Exercise Activities and Dietary recommendations Goals   None     Immunization History  Administered Date(s) Administered  . Fluad Quad(high Dose  65+) 02/25/2019, 02/05/2020  . Influenza, High Dose Seasonal PF 03/11/2016  . PFIZER Comirnaty(Gray Top)Covid-19 Tri-Sucrose Vaccine 09/02/2020  . PFIZER(Purple Top)SARS-COV-2 Vaccination 07/04/2019, 07/25/2019, 03/08/2020  . Pneumococcal Conjugate-13 07/08/2014  . Pneumococcal Polysaccharide-23 06/11/2012  . Tdap 06/11/2012  . Zoster 06/11/2012    Health Maintenance  Topic Date Due  . INFLUENZA VACCINE  12/27/2020  . TETANUS/TDAP  06/11/2022  . COLONOSCOPY (Pts 45-8yrs Insurance coverage will need to be confirmed)  11/20/2024  . COVID-19 Vaccine  Completed  . Hepatitis C Screening  Completed  . PNA vac Low Risk Adult  Completed  . HPV VACCINES  Aged Out     Discussed health benefits of physical activity, and encouraged him to engage in regular exercise appropriate for his age and condition.    1. Medicare annual wellness visit, subsequent  - CBC w/Diff/Platelet - Comprehensive Metabolic Panel (CMET) - Lipid panel - TSH  2. Pure hypercholesterolemia  - CBC w/Diff/Platelet - Comprehensive Metabolic Panel (CMET) - Lipid panel - TSH  3. Benign prostatic hyperplasia, unspecified whether lower urinary tract symptoms present  - CBC w/Diff/Platelet - Comprehensive Metabolic Panel (CMET) - Lipid panel - TSH  4. Malignant melanoma metastatic to bone (HCC)  - CBC w/Diff/Platelet - Comprehensive Metabolic Panel (CMET) - Lipid panel - TSH  5. Gastroesophageal reflux disease, unspecified whether esophagitis present  - CBC w/Diff/Platelet - Comprehensive Metabolic Panel (CMET) - Lipid panel - TSH 6.GERD  With chronic  hoarseness Return in about 6 months (around 04/20/2021).     I, Marc Durie, MD, have reviewed all documentation for this visit. The documentation on 10/25/20 for the exam, diagnosis, procedures, and orders are all accurate and complete.    Daryl Quiros Cranford Mon, MD  [facility] [phone] (phone) [phone] (fax)  [place]

## 2020-10-18 NOTE — Patient Instructions (Signed)
STOP ASPIRIN. Consider getting the Shingles vaccine.

## 2020-10-20 LAB — COMPREHENSIVE METABOLIC PANEL
ALT: 15 IU/L (ref 0–44)
AST: 18 IU/L (ref 0–40)
Albumin/Globulin Ratio: 1.9 (ref 1.2–2.2)
Albumin: 4.8 g/dL — ABNORMAL HIGH (ref 3.7–4.7)
Alkaline Phosphatase: 63 IU/L (ref 44–121)
BUN/Creatinine Ratio: 13 (ref 10–24)
BUN: 17 mg/dL (ref 8–27)
Bilirubin Total: 0.6 mg/dL (ref 0.0–1.2)
CO2: 24 mmol/L (ref 20–29)
Calcium: 10.2 mg/dL (ref 8.6–10.2)
Chloride: 100 mmol/L (ref 96–106)
Creatinine, Ser: 1.31 mg/dL — ABNORMAL HIGH (ref 0.76–1.27)
Globulin, Total: 2.5 g/dL (ref 1.5–4.5)
Glucose: 104 mg/dL — ABNORMAL HIGH (ref 65–99)
Potassium: 5 mmol/L (ref 3.5–5.2)
Sodium: 139 mmol/L (ref 134–144)
Total Protein: 7.3 g/dL (ref 6.0–8.5)
eGFR: 57 mL/min/{1.73_m2} — ABNORMAL LOW (ref >59)

## 2020-10-20 LAB — LIPID PANEL
Chol/HDL Ratio: 2.8 ratio (ref 0.0–5.0)
Cholesterol, Total: 227 mg/dL — ABNORMAL HIGH (ref 100–199)
HDL: 80 mg/dL (ref >39)
LDL Chol Calc (NIH): 130 mg/dL — ABNORMAL HIGH (ref 0–99)
Triglycerides: 97 mg/dL (ref 0–149)
VLDL Cholesterol Cal: 17 mg/dL (ref 5–40)

## 2020-10-20 LAB — CBC WITH DIFFERENTIAL/PLATELET
Basophils Absolute: 0 10*3/uL (ref 0.0–0.2)
Basos: 1 %
EOS (ABSOLUTE): 0.2 10*3/uL (ref 0.0–0.4)
Eos: 4 %
Hematocrit: 43.4 % (ref 37.5–51.0)
Hemoglobin: 14.5 g/dL (ref 13.0–17.7)
Immature Grans (Abs): 0 10*3/uL (ref 0.0–0.1)
Immature Granulocytes: 0 %
Lymphocytes Absolute: 1.2 10*3/uL (ref 0.7–3.1)
Lymphs: 27 %
MCH: 30.9 pg (ref 26.6–33.0)
MCHC: 33.4 g/dL (ref 31.5–35.7)
MCV: 92 fL (ref 79–97)
Monocytes Absolute: 0.5 10*3/uL (ref 0.1–0.9)
Monocytes: 11 %
Neutrophils Absolute: 2.6 10*3/uL (ref 1.4–7.0)
Neutrophils: 57 %
Platelets: 230 10*3/uL (ref 150–450)
RBC: 4.7 x10E6/uL (ref 4.14–5.80)
RDW: 12.6 % (ref 11.6–15.4)
WBC: 4.5 10*3/uL (ref 3.4–10.8)

## 2020-10-20 LAB — TSH: TSH: 2.46 u[IU]/mL (ref 0.450–4.500)

## 2020-10-26 ENCOUNTER — Telehealth: Payer: Self-pay

## 2020-10-26 NOTE — Telephone Encounter (Signed)
Returned call to patient.

## 2020-10-26 NOTE — Telephone Encounter (Signed)
Copied from [place] [id number]. Topic: General - Other >> Oct 26, 2020 11:23 AM Valere Dross wrote: Reason for CRM: Patient called in stating some more information about his labs and if they are pertaining to prostate, pt would like a nurse to call back. Please advise

## 2020-11-20 ENCOUNTER — Other Ambulatory Visit: Payer: Self-pay | Admitting: Family Medicine

## 2020-11-20 NOTE — Telephone Encounter (Signed)
Requested Prescriptions  Pending Prescriptions Disp Refills  . simvastatin (ZOCOR) 40 MG tablet [Pharmacy Med Name: SIMVASTATIN 40 MG TAB] 30 tablet 2    Sig: TAKE ONE TABLET BY MOUTH EVERY DAY     Cardiovascular:  Antilipid - Statins Failed - 11/20/2020  8:34 AM      Failed - Total Cholesterol in normal range and within 360 days    Cholesterol, Total  Date Value Ref Range Status  10/19/2020 227 (H) 100 - 199 mg/dL Final         Failed - LDL in normal range and within 360 days    LDL Chol Calc (NIH)  Date Value Ref Range Status  10/19/2020 130 (H) 0 - 99 mg/dL Final         Passed - HDL in normal range and within 360 days    HDL  Date Value Ref Range Status  10/19/2020 80 >39 mg/dL Final         Passed - Triglycerides in normal range and within 360 days    Triglycerides  Date Value Ref Range Status  10/19/2020 97 0 - 149 mg/dL Final         Passed - Patient is not pregnant      Passed - Valid encounter within last 12 months    Recent Outpatient Visits          1 month ago Medicare annual wellness visit, subsequent   [facility] Jerrol Banana., MD   6 months ago Pure hypercholesterolemia   [facility] Jerrol Banana., MD   1 year ago Pure hypercholesterolemia   [facility] Jerrol Banana., MD   2 years ago Encounter for annual physical exam   [facility] Jerrol Banana., MD   3 years ago Annual physical exam   [facility] Jerrol Banana., MD      Future Appointments            In 5 months Jerrol Banana., MD [facility] [place]

## 2021-02-02 ENCOUNTER — Ambulatory Visit (INDEPENDENT_AMBULATORY_CARE_PROVIDER_SITE_OTHER): Payer: Medicare Other

## 2021-02-02 ENCOUNTER — Other Ambulatory Visit: Payer: Self-pay

## 2021-02-02 DIAGNOSIS — Z23 Encounter for immunization: Secondary | ICD-10-CM

## 2021-02-22 ENCOUNTER — Ambulatory Visit: Payer: Medicare Other | Attending: Internal Medicine

## 2021-02-22 ENCOUNTER — Other Ambulatory Visit: Payer: Self-pay

## 2021-02-22 DIAGNOSIS — Z23 Encounter for immunization: Secondary | ICD-10-CM

## 2021-02-22 MED ORDER — PFIZER COVID-19 VAC BIVALENT 30 MCG/0.3ML IM SUSP
INTRAMUSCULAR | 0 refills | Status: DC
  Filled 2021-02-22: qty 0.3, 1d supply, fill #0

## 2021-02-22 NOTE — Progress Notes (Signed)
   Covid-19 Vaccination Clinic  Name:  Marc Munoz    MRN: [id number] DOB: September 15, 1945  02/22/2021  Marc Munoz was observed post Covid-19 immunization for 15 minutes without incident. He was provided with Vaccine Information Sheet and instruction to access the V-Safe system.   Marc Munoz was instructed to call 911 with any severe reactions post vaccine: Difficulty breathing  Swelling of face and throat  A fast heartbeat  A bad rash all over body  Dizziness and weakness   Lu Duffel, PharmD, MBA Clinical Acute Care Pharmacist

## 2021-02-23 ENCOUNTER — Other Ambulatory Visit: Payer: Self-pay | Admitting: Family Medicine

## 2021-04-19 NOTE — Progress Notes (Signed)
I,April Miller,acting as a scribe for Wilhemena Durie, MD.,have documented all relevant documentation on the behalf of Wilhemena Durie, MD,as directed by  Wilhemena Durie, MD while in the presence of Wilhemena Durie, MD.   Established patient visit   Patient: Marc Munoz   DOB: 1945-12-12   75 y.o. Male  MRN: [id number] Visit Date: 04/20/2021  Today's healthcare provider: Wilhemena Durie, MD   Chief Complaint  Patient presents with   Follow-up   Hyperlipidemia   Gastroesophageal Reflux   Subjective    HPI  Patient comes in today for follow-up.  He feels well.  Is playing competitive tennis and is getting ready crossover to over 75 bracket. Did apparently find a new spot on his lung from his metastatic melanoma and he is getting immunotherapy every 3 weeks through his [facility] oncologist. Scan this Saturday. GERD, Follow up  The patient was last seen for GERD 6 months ago. Changes made since that visit include continue current medication.  He reports good compliance with treatment. He is not having side effects. none.  He IS experiencing  n/a . He is NOT experiencing  n/a  -----------------------------------------------------------------------------------------  Lipid/Cholesterol, Follow-up  Last lipid panel Other pertinent labs  Lab Results  Component Value Date   CHOL 227 (H) 10/19/2020   HDL 80 10/19/2020   LDLCALC 130 (H) 10/19/2020   TRIG 97 10/19/2020   CHOLHDL 2.8 10/19/2020   Lab Results  Component Value Date   ALT 15 10/19/2020   AST 18 10/19/2020   PLT 230 10/19/2020   TSH 2.460 10/19/2020     He was last seen for this 6 months ago.  Management since that visit includes continue current medication.  He reports good compliance with treatment. He is not having side effects. none  Current diet: well balanced Current exercise: walking and Tennis  The 10-year ASCVD risk score (Arnett DK, et al., 2019) is:  21.1%  ---------------------------------------------------------------------------------------------------    Medications: Outpatient Medications Prior to Visit  Medication Sig   calcium-vitamin D (CALCIUM 500/D) 500-200 MG-UNIT tablet Take 1 tablet by mouth daily.   Cholecalciferol (VITAMIN D3) 10 MCG (400 UNIT) CAPS Take 1 capsule by mouth daily.   COVID-19 mRNA bivalent vaccine, Pfizer, (PFIZER COVID-19 VAC BIVALENT) injection Inject into the muscle.   COVID-19 mRNA Vac-TriS, Pfizer, (PFIZER-BIONT COVID-19 VAC-TRIS) SUSP injection Inject into the muscle.   Multiple Vitamins tablet Take 1 tablet by mouth daily.    nivolumab 480 mg in sodium chloride 0.9 % 100 mL Inject 480 mg into the vein.   Nutritional Supplements (IMMUNE ENHANCE) TABS Take 1 tablet by mouth daily.    simvastatin (ZOCOR) 40 MG tablet TAKE 1 TABLET BY MOUTH DAILY   tamsulosin (FLOMAX) 0.4 MG CAPS capsule TAKE 1 CAPSULE EVERY DAY   aspirin EC 81 MG tablet Take 81 mg by mouth daily. (Patient not taking: Reported on 04/20/2021)   omeprazole (PRILOSEC) 20 MG capsule Take 1 capsule (20 mg total) by mouth every morning. (Patient not taking: Reported on 04/20/2021)   [DISCONTINUED] denosumab (PROLIA) 60 MG/ML SOSY injection Inject 60 mg into the skin every 6 (six) months. (Patient not taking: Reported on 10/18/2020)   [DISCONTINUED] predniSONE (DELTASONE) 20 MG tablet Take 1 tablet (20 mg total) by mouth daily with breakfast. (Patient not taking: Reported on 10/18/2020)   [DISCONTINUED] Vitamin D, Ergocalciferol, (DRISDOL) 1.25 MG (50000 UT) CAPS capsule TAKE 1 CAPSULE EVERY WEEK (Patient not taking: No sig reported)  No facility-administered medications prior to visit.    Review of Systems      Objective    BP 129/75 (BP Location: Left Arm, Patient Position: Sitting, Cuff Size: Normal)   Pulse (!) 53   Resp 16   Ht 6' (1.829 m)   Wt 158 lb (71.7 kg)   SpO2 99%   BMI 21.43 kg/m  Wt Readings from Last 3 Encounters:   04/20/21 158 lb (71.7 kg)  10/18/20 162 lb (73.5 kg)  05/03/20 164 lb (74.4 kg)      Physical Exam Vitals reviewed.  Constitutional:      Appearance: Normal appearance. He is well-developed.  HENT:     Head: Normocephalic and atraumatic.     Right Ear: Tympanic membrane and external ear normal.     Left Ear: Tympanic membrane and external ear normal.     Nose: Nose normal.     Mouth/Throat:     Pharynx: Oropharynx is clear.  Eyes:     General: No scleral icterus.    Conjunctiva/sclera: Conjunctivae normal.     Pupils: Pupils are equal, round, and reactive to light.  Neck:     Thyroid: No thyromegaly.  Cardiovascular:     Rate and Rhythm: Normal rate and regular rhythm.     Heart sounds: Normal heart sounds.  Pulmonary:     Effort: Pulmonary effort is normal.     Breath sounds: Normal breath sounds.  Abdominal:     Palpations: Abdomen is soft.  Lymphadenopathy:     Cervical: No cervical adenopathy.  Skin:    General: Skin is warm and dry.     Comments: Fair skin.  Neurological:     General: No focal deficit present.     Mental Status: He is alert and oriented to person, place, and time.  Psychiatric:        Mood and Affect: Mood normal.        Behavior: Behavior normal.        Thought Content: Thought content normal.        Judgment: Judgment normal.      No results found for any visits on 04/20/21.  Assessment & Plan     1. Melanoma metastatic to lung, unspecified laterality ([place]) Scheduled for a scan in 3 days.  ImmunoTherapy every 3 weeks. 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  Is well and is enjoying life. Only son is 37 and works for the [place] and is getting ready to get married early next year.  2. Merkel cell cancer ([place]) By Dr. Evorn Gong from dermatology  3. Pure hypercholesterolemia Zocor  4. Avitaminosis D    Return in about 6 months (around 10/18/2021).      I, Wilhemena Durie, MD, have reviewed all  documentation for this visit. The documentation on 04/21/21 for the exam, diagnosis, procedures, and orders are all accurate and complete.    Kealan Buchan Cranford Mon, MD  [facility] [phone] (phone) [phone] (fax)  [place]

## 2021-04-20 ENCOUNTER — Ambulatory Visit (INDEPENDENT_AMBULATORY_CARE_PROVIDER_SITE_OTHER): Payer: Medicare Other | Admitting: Family Medicine

## 2021-04-20 ENCOUNTER — Other Ambulatory Visit: Payer: Self-pay

## 2021-04-20 ENCOUNTER — Encounter: Payer: Self-pay | Admitting: Family Medicine

## 2021-04-20 VITALS — BP 129/75 | HR 53 | Resp 16 | Ht 72.0 in | Wt 158.0 lb

## 2021-04-20 DIAGNOSIS — C7800 Secondary malignant neoplasm of unspecified lung: Secondary | ICD-10-CM

## 2021-04-20 DIAGNOSIS — E559 Vitamin D deficiency, unspecified: Secondary | ICD-10-CM

## 2021-04-20 DIAGNOSIS — C4A9 Merkel cell carcinoma, unspecified: Secondary | ICD-10-CM | POA: Diagnosis not present

## 2021-04-20 DIAGNOSIS — E7800 Pure hypercholesterolemia, unspecified: Secondary | ICD-10-CM | POA: Diagnosis not present

## 2021-04-27 ENCOUNTER — Other Ambulatory Visit: Payer: Self-pay | Admitting: Family Medicine

## 2021-04-27 DIAGNOSIS — N400 Enlarged prostate without lower urinary tract symptoms: Secondary | ICD-10-CM

## 2021-05-27 ENCOUNTER — Ambulatory Visit: Payer: Self-pay

## 2021-05-27 NOTE — Telephone Encounter (Signed)
Summary: severe cough 5+ days/covid neg/cancer pt   Wife calling to report pt has been sick since Monday. He is a cancer pt at [employer].  She took him to [facility] emergency room early Monday morning.  He has severe coughing.  Coughing so loud he is sleeping in a room other side of house and she can still hear him.  He was very very hot Monday. (Thermometer wasn't working) covid and flu negative.  But he is not better, except fever is gone.  She would like him checked out today at [facility].  She said they ben coming here since the beginning and they are long time pts of Dr Rosanna Randy.       Chief Complaint: Cough, sore throat Symptoms: Cancer pt. Cough, sore throat Frequency: Started Monday. Pertinent Negatives: Patient denies shortness of breath Disposition: [] ED /[] Urgent Care (no appt availability in office) / [] Appointment(In office/virtual)/ []  [facility]/ [] Home Care/ [] Refused Recommended Disposition /[] [facility]/ []  Follow-up with PCP Additional Notes: Seen in ED Monday. Negative COVID, Flu, RSV. Still coughing and today has sore throat. Sulie in practice requests triage for review for possible work-in today. Please advise pt.  Answer Assessment - Initial Assessment Questions 1. ONSET: "When did the cough begin?"      Monday 2. SEVERITY: "How bad is the cough today?"      Severe 3. SPUTUM: "Describe the color of your sputum" (none, dry cough; clear, white, yellow, green)     Green-yellow 4. HEMOPTYSIS: "Are you coughing up any blood?" If so ask: "How much?" (flecks, streaks, tablespoons, etc.)     No 5. DIFFICULTY BREATHING: "Are you having difficulty breathing?" If Yes, ask: "How bad is it?" (e.g., mild, moderate, severe)    - MILD: No SOB at rest, mild SOB with walking, speaks normally in sentences, can lie down, no retractions, pulse < 100.    - MODERATE: SOB at rest, SOB with minimal exertion and prefers to sit, cannot lie down flat, speaks in phrases, mild  retractions, audible wheezing, pulse 100-120.    - SEVERE: Very SOB at rest, speaks in single words, struggling to breathe, sitting hunched forward, retractions, pulse > 120      None 6. FEVER: "Do you have a fever?" If Yes, ask: "What is your temperature, how was it measured, and when did it start?"     Last night - 99.5 7. CARDIAC HISTORY: "Do you have any history of heart disease?" (e.g., heart attack, congestive heart failure)      No 8. LUNG HISTORY: "Do you have any history of lung disease?"  (e.g., pulmonary embolus, asthma, emphysema)     No 9. PE RISK FACTORS: "Do you have a history of blood clots?" (or: recent major surgery, recent prolonged travel, bedridden)     No 10. OTHER SYMPTOMS: "Do you have any other symptoms?" (e.g., runny nose, wheezing, chest pain)       Cough, sore throat 11. PREGNANCY: "Is there any chance you are pregnant?" "When was your last menstrual period?"       N/a 12. TRAVEL: "Have you traveled out of the country in the last month?" (e.g., travel history, exposures)       No  Protocols used: Cough - Acute Productive-A-AH

## 2021-05-27 NOTE — Telephone Encounter (Signed)
Patient advised as below.  

## 2021-05-27 NOTE — Telephone Encounter (Signed)
Needs to go to urgent care

## 2021-05-28 ENCOUNTER — Encounter: Payer: Self-pay | Admitting: Emergency Medicine

## 2021-05-28 ENCOUNTER — Ambulatory Visit
Admission: EM | Admit: 2021-05-28 | Discharge: 2021-05-28 | Disposition: A | Discharge status: Home / Self Care | Payer: 59 | Attending: Emergency Medicine | Admitting: Emergency Medicine

## 2021-05-28 ENCOUNTER — Other Ambulatory Visit: Payer: Self-pay

## 2021-05-28 DIAGNOSIS — R059 Cough, unspecified: Secondary | ICD-10-CM

## 2021-05-28 MED ORDER — BENZONATATE 100 MG PO CAPS: 100.0000 mg | ORAL_CAPSULE | Freq: Three times a day (TID) | ORAL | 0 refills | Status: DC | PRN

## 2021-05-28 NOTE — Discharge Instructions (Addendum)
Take the Tessalon Perles as needed for cough.  Follow up with your primary care provider if your symptoms are not improving.    

## 2021-05-28 NOTE — ED Triage Notes (Signed)
Pt here with viral illness that started 10 days ago. Pt is a cancer patient undergoing immunotherapy and chemo and last tx was 12/21. Pt was seen in ED on 12/26 and thorough workup completed. Pt presents now with a persistent cough that is productive and keeps him up at night.

## 2021-05-28 NOTE — ED Provider Notes (Signed)
Marc Munoz    CSN: [id number] Arrival date & time: 05/28/21  [id number]      History   Chief Complaint Chief Complaint  Patient presents with   Cough    HPI Marc Munoz is a 74 y.o. male.  Accompanied by his wife, patient presents with persistent cough x 10 days.  His cough is productive of green-yellow phlegm and worse at night.  No fever in the last 48 hours.  His stamina is improving and he is playing multiple rounds of tennis daily.  He denies chest pain, shortness of breath, or other symptoms.  Treatment at home with cough drops and Mucinex.  Patient was seen on 05/23/2021 at [facility] ED; negative for COVID, flu, RSV; chest x-ray negative for pneumonia; diagnosed with acute cough, body aches.  He had a negative COVID test again on 05/25/2021.  His medical history includes malignant melanoma with metastasis to lung and bone.  The history is provided by the patient, the spouse and medical records.   Past Medical History:  Diagnosis Date   Cancer [facility])    melanoma   Hyperlipemia     Patient Active Problem List   Diagnosis Date Noted   Syncope 02/08/2019   Left Achilles tendinitis 02/19/2018   ED (erectile dysfunction) 07/14/2015   DD (diverticular disease) 06/03/2015   H/O malignant neoplasm of skin 06/03/2015   HLD (hyperlipidemia) 06/03/2015   Merkel cell cancer ([place]) 06/03/2015   Arthropathy of temporomandibular joint 06/03/2015   Avitaminosis D 06/03/2015   Peripheral vascular disease ([place]) 05/08/2014    Past Surgical History:  Procedure Laterality Date   COLONOSCOPY N/A 05/03/2015   Procedure: COLONOSCOPY;  Surgeon: Manya Silvas, MD;  Location: [facility];  Service: Endoscopy;  Laterality: N/A;   HERNIA REPAIR     MELANOMA EXCISION         Home Medications    Prior to Admission medications   Medication Sig Start Date End Date Taking? Authorizing Provider  benzonatate (TESSALON) 100 MG capsule Take 1 capsule (100 mg total) by mouth 3 (three)  times daily as needed for cough. 05/28/21  Yes Sharion Balloon, NP  aspirin EC 81 MG tablet Take 81 mg by mouth daily. Patient not taking: Reported on 04/20/2021    [provider]  calcium-vitamin D (CALCIUM 500/D) 500-200 MG-UNIT tablet Take 1 tablet by mouth daily.    [provider]  Cholecalciferol (VITAMIN D3) 10 MCG (400 UNIT) CAPS Take 1 capsule by mouth daily.    [provider]  COVID-19 mRNA bivalent vaccine, Pfizer, (PFIZER COVID-19 VAC BIVALENT) injection Inject into the muscle. 02/22/21   Carlyle Basques, MD  COVID-19 mRNA Vac-TriS, Pfizer, (PFIZER-BIONT COVID-19 VAC-TRIS) SUSP injection Inject into the muscle. 09/02/20   Carlyle Basques, MD  Multiple Vitamins tablet Take 1 tablet by mouth daily.  04/10/11   [provider]  nivolumab 480 mg in sodium chloride 0.9 % 100 mL Inject 480 mg into the vein.    [provider]  Nutritional Supplements (IMMUNE ENHANCE) TABS Take 1 tablet by mouth daily.  04/10/11   [provider]  omeprazole (PRILOSEC) 20 MG capsule Take 1 capsule (20 mg total) by mouth every morning. Patient not taking: Reported on 04/20/2021 05/03/20   Jerrol Banana., MD  simvastatin (ZOCOR) 40 MG tablet TAKE 1 TABLET BY MOUTH DAILY 02/23/21   Jerrol Banana., MD  tamsulosin (FLOMAX) 0.4 MG CAPS capsule TAKE 1 CAPSULE BY MOUTH ONCE DAILY 04/27/21  Jerrol Banana., MD    Family History Family History  Problem Relation Age of Onset   Stroke Brother    Heart disease Mother    Hypertension Mother    Hypertension Father    Arthritis Father    COPD Father    Colon cancer Father     Social History Social History   Tobacco Use   Smoking status: Never   Smokeless tobacco: Never  Vaping Use   Vaping Use: Never used  Substance Use Topics   Alcohol use: Yes    Alcohol/week: 1.0 standard drink    Types: 1 Cans of beer per week    Comment: occasional   Drug use: No     Allergies   Patient  has no known allergies.   Review of Systems Review of Systems  Constitutional:  Negative for chills and fever.  HENT:  Negative for ear pain and sore throat.   Respiratory:  Positive for cough. Negative for shortness of breath and wheezing.   Cardiovascular:  Negative for chest pain and palpitations.  Gastrointestinal:  Negative for diarrhea and vomiting.  Skin:  Negative for color change and rash.  All other systems reviewed and are negative.   Physical Exam Triage Vital Signs ED Triage Vitals  Enc Vitals Group     BP      Pulse      Resp      Temp      Temp src      SpO2      Weight      Height      Head Circumference      Peak Flow      Pain Score      Pain Loc      Pain Edu?      Excl. in [place]?    No data found.  Updated Vital Signs BP 132/70    Pulse 74    Temp 98.1 F (36.7 C) (Oral)    Resp 18    SpO2 98%   Visual Acuity Right Eye Distance:   Left Eye Distance:   Bilateral Distance:    Right Eye Near:   Left Eye Near:    Bilateral Near:     Physical Exam Vitals and nursing note reviewed.  Constitutional:      General: He is not in acute distress.    Appearance: Normal appearance. He is well-developed. He is not ill-appearing.  HENT:     Right Ear: Tympanic membrane normal.     Left Ear: Tympanic membrane normal.     Nose: Nose normal.     Mouth/Throat:     Mouth: Mucous membranes are moist.     Pharynx: Oropharynx is clear.  Cardiovascular:     Rate and Rhythm: Normal rate and regular rhythm.     Heart sounds: Normal heart sounds.  Pulmonary:     Effort: Pulmonary effort is normal. No respiratory distress.     Breath sounds: Normal breath sounds.  Musculoskeletal:     Cervical back: Neck supple.  Skin:    General: Skin is warm and dry.  Neurological:     Mental Status: He is alert.  Psychiatric:        Mood and Affect: Mood normal.        Behavior: Behavior normal.     UC Treatments / Results  Labs (all labs ordered are listed, but  only abnormal results are displayed) Labs Reviewed - No data to display  EKG   Radiology No results found.  Procedures Procedures (including critical care time)  Medications Ordered in UC Medications - No data to display  Initial Impression / Assessment and Plan / UC Course  I have reviewed the triage vital signs and the nursing notes.  Pertinent labs & imaging results that were available during my care of the patient were reviewed by me and considered in my medical decision making (see chart for details).    Cough.  Patient has had a lingering cough from a viral illness.  No shortness of breath.  Afebrile and vital signs stable.  Lungs are clear, O2 sat 98% on room air.  Treating cough with Tessalon Perles.  Instructed him to continue Mucinex.  Instructed him to follow-up with his PCP on Monday if his symptoms are not improving.  He agrees to plan of care.  Final Clinical Impressions(s) / UC Diagnoses   Final diagnoses:  Cough, unspecified type     Discharge Instructions      Take the Tessalon Perles as needed for cough.  Follow up with your primary care provider if your symptoms are not improving.         ED Prescriptions     Medication Sig Dispense Auth. Provider   benzonatate (TESSALON) 100 MG capsule Take 1 capsule (100 mg total) by mouth 3 (three) times daily as needed for cough. 21 capsule Sharion Balloon, NP      PDMP not reviewed this encounter.   Sharion Balloon, NP 05/28/21 [phone]

## 2021-05-30 ENCOUNTER — Ambulatory Visit
Admission: EM | Admit: 2021-05-30 | Discharge: 2021-05-30 | Disposition: A | Discharge status: Home / Self Care | Payer: Medicare Other | Attending: Emergency Medicine | Admitting: Emergency Medicine

## 2021-05-30 ENCOUNTER — Other Ambulatory Visit: Payer: Self-pay

## 2021-05-30 ENCOUNTER — Ambulatory Visit (INDEPENDENT_AMBULATORY_CARE_PROVIDER_SITE_OTHER): Payer: Medicare Other

## 2021-05-30 ENCOUNTER — Encounter: Payer: Self-pay | Admitting: Emergency Medicine

## 2021-05-30 ENCOUNTER — Telehealth: Payer: Self-pay | Admitting: Emergency Medicine

## 2021-05-30 DIAGNOSIS — Z85118 Personal history of other malignant neoplasm of bronchus and lung: Secondary | ICD-10-CM | POA: Diagnosis not present

## 2021-05-30 DIAGNOSIS — J029 Acute pharyngitis, unspecified: Secondary | ICD-10-CM | POA: Insufficient documentation

## 2021-05-30 DIAGNOSIS — R051 Acute cough: Secondary | ICD-10-CM | POA: Insufficient documentation

## 2021-05-30 DIAGNOSIS — C7802 Secondary malignant neoplasm of left lung: Secondary | ICD-10-CM | POA: Diagnosis present

## 2021-05-30 DIAGNOSIS — B349 Viral infection, unspecified: Secondary | ICD-10-CM | POA: Insufficient documentation

## 2021-05-30 DIAGNOSIS — R059 Cough, unspecified: Secondary | ICD-10-CM | POA: Diagnosis not present

## 2021-05-30 LAB — GROUP A STREP BY PCR: Group A Strep by PCR: NOT DETECTED

## 2021-05-30 MED ORDER — HYDROCOD POLST-CPM POLST ER 10-8 MG/5ML PO SUER: 5.0000 mL | Freq: Two times a day (BID) | ORAL | 0 refills | Status: DC | PRN

## 2021-05-30 MED ORDER — LIDOCAINE VISCOUS HCL 2 % MT SOLN: 15.0000 mL | OROMUCOSAL | 0 refills | Status: DC | PRN

## 2021-05-30 NOTE — ED Triage Notes (Signed)
Wife notes that patient is a metastatic melanoma patient. He went to ED 12/26 with high fever. No fever since. Today, severe throat that started 3-4 days ago.   PT was negative for covid,. Flu RSV earlier in the week.   Wife concerned about strep.

## 2021-05-30 NOTE — Telephone Encounter (Signed)
Pt wife called stating pt didn't realize normal pharmacy was closed and needs rx resent to [facility] [address]. Resent Lidocaine, Jerene Pitch will send in the controlled medication and Rx that was sent to [facility], I have called and cancelled rx [facility] to [facility].

## 2021-05-30 NOTE — ED Provider Notes (Signed)
[facility]    CSN: [id number] Arrival date & time: 05/30/21  0254      History   Chief Complaint Chief Complaint  Patient presents with   Sore Throat    HPI Marc CATALA is a 76 y.o. male with history of metastatic melanoma.  Patient has history of metastasis to bone and lung.  Most recently informed that he had a tumor on his lung and is undergoing treatment.  Patient presents with his wife today for 1 week history of cough, congestion, sore throat and voice hoarseness.  Reports significant sore throat and worsening voice hoarseness over the past 3 days or so.  Patient was initially seen 05/23/2021 when symptoms began in the emergency department.  He was negative for COVID-19, influenza and RSV.  Also had a negative chest x-ray and negative blood cultures.  Patient had another negative COVID test on 05/25/2021.  He was seen at [facility] urgent care 2 days ago for the same symptoms.  Patient reassured that he had a viral illness and prescribed benzonatate for cough.  Has also been taking Mucinex and using throat lozenges.  Reports unsure if it is helping since cough is still keeping him up at night.  Reports increased fatigue.  Patient denies any fever.  Reports that he felt feverish the first couple of days but never recorded the temperature.  Denies any sinus pain, headaches, chest pain, breathing difficulty or wheezing.  Patient's wife is concerned about his voice hoarseness and sore throat.  Would like a strep test.  Patient's wife helps to provide some of the medical history since his voice is very hoarse.  HPI  Past Medical History:  Diagnosis Date   Cancer ([place])    melanoma   Hyperlipemia     Patient Active Problem List   Diagnosis Date Noted   Syncope 02/08/2019   Left Achilles tendinitis 02/19/2018   ED (erectile dysfunction) 07/14/2015   DD (diverticular disease) 06/03/2015   H/O malignant neoplasm of skin 06/03/2015   HLD (hyperlipidemia) 06/03/2015    Merkel cell cancer ([place]) 06/03/2015   Arthropathy of temporomandibular joint 06/03/2015   Avitaminosis D 06/03/2015   Peripheral vascular disease ([place]) 05/08/2014    Past Surgical History:  Procedure Laterality Date   COLONOSCOPY N/A 05/03/2015   Procedure: COLONOSCOPY;  Surgeon: Manya Silvas, MD;  Location: [place];  Service: Endoscopy;  Laterality: N/A;   HERNIA REPAIR     MELANOMA EXCISION         Home Medications    Prior to Admission medications   Medication Sig Start Date End Date Taking? Authorizing Provider  chlorpheniramine-HYDROcodone (TUSSIONEX PENNKINETIC ER) 10-8 MG/5ML SUER Take 5 mLs by mouth every 12 (twelve) hours as needed for cough. 05/30/21  Yes Laurene Footman B, PA-C  lidocaine (XYLOCAINE) 2 % solution Use as directed 15 mLs in the mouth or throat every 3 (three) hours as needed for mouth pain (swish and spit). 05/30/21  Yes Danton Clap, PA-C  aspirin EC 81 MG tablet Take 81 mg by mouth daily. Patient not taking: Reported on 04/20/2021    [provider]  benzonatate (TESSALON) 100 MG capsule Take 1 capsule (100 mg total) by mouth 3 (three) times daily as needed for cough. 05/28/21   Sharion Balloon, NP  calcium-vitamin D (CALCIUM 500/D) 500-200 MG-UNIT tablet Take 1 tablet by mouth daily.    [provider]  Cholecalciferol (VITAMIN D3) 10 MCG (400 UNIT) CAPS Take 1 capsule by mouth  daily.    [provider]  COVID-19 mRNA bivalent vaccine, Pfizer, (PFIZER COVID-19 VAC BIVALENT) injection Inject into the muscle. 02/22/21   Carlyle Basques, MD  COVID-19 mRNA Vac-TriS, Pfizer, (PFIZER-BIONT COVID-19 VAC-TRIS) SUSP injection Inject into the muscle. 09/02/20   Carlyle Basques, MD  Multiple Vitamins tablet Take 1 tablet by mouth daily.  04/10/11   [provider]  nivolumab 480 mg in sodium chloride 0.9 % 100 mL Inject 480 mg into the vein.    [provider]  Nutritional Supplements (IMMUNE ENHANCE) TABS Take 1 tablet  by mouth daily.  04/10/11   [provider]  omeprazole (PRILOSEC) 20 MG capsule Take 1 capsule (20 mg total) by mouth every morning. Patient not taking: Reported on 04/20/2021 05/03/20   Jerrol Banana., MD  simvastatin (ZOCOR) 40 MG tablet TAKE 1 TABLET BY MOUTH DAILY 02/23/21   Jerrol Banana., MD  tamsulosin [facility]) 0.4 MG CAPS capsule TAKE 1 CAPSULE BY MOUTH ONCE DAILY 04/27/21   Jerrol Banana., MD    Family History Family History  Problem Relation Age of Onset   Stroke Brother    Heart disease Mother    Hypertension Mother    Hypertension Father    Arthritis Father    COPD Father    Colon cancer Father     Social History Social History   Tobacco Use   Smoking status: Never   Smokeless tobacco: Never  Vaping Use   Vaping Use: Never used  Substance Use Topics   Alcohol use: Yes    Alcohol/week: 1.0 standard drink    Types: 1 Cans of beer per week    Comment: occasional   Drug use: No     Allergies   Patient has no known allergies.   Review of Systems Review of Systems  Constitutional:  Positive for fatigue. Negative for fever.  HENT:  Positive for congestion, rhinorrhea, sore throat and voice change. Negative for sinus pressure and sinus pain.   Respiratory:  Positive for cough. Negative for shortness of breath and wheezing.   Cardiovascular:  Negative for chest pain.  Gastrointestinal:  Negative for abdominal pain, diarrhea, nausea and vomiting.  Musculoskeletal:  Negative for myalgias.  Neurological:  Negative for weakness, light-headedness and headaches.  Hematological:  Negative for adenopathy.  Psychiatric/Behavioral:  Positive for sleep disturbance.     Physical Exam Triage Vital Signs ED Triage Vitals  Enc Vitals Group     BP 05/30/21 0848 113/67     Pulse Rate 05/30/21 0848 78     Resp 05/30/21 0848 16     Temp 05/30/21 0848 98.2 F (36.8 C)     Temp Source 05/30/21 0848 Oral     SpO2 05/30/21 0848 100 %      Weight --      Height --      Head Circumference --      Peak Flow --      Pain Score 05/30/21 0842 4     Pain Loc --      Pain Edu? --      Excl. in [place]? --    No data found.  Updated Vital Signs BP 113/67    Pulse 78    Temp 98.2 F (36.8 C) (Oral)    Resp 16    SpO2 100%     Physical Exam Vitals and nursing note reviewed.  Constitutional:      General: He is not in acute distress.  Appearance: Normal appearance. He is well-developed. He is ill-appearing.     Comments: +significant voice hoarseness  HENT:     Head: Normocephalic and atraumatic.     Nose: Congestion present.     Mouth/Throat:     Mouth: Mucous membranes are moist.     Pharynx: Oropharynx is clear. Posterior oropharyngeal erythema present.  Eyes:     General: No scleral icterus.    Conjunctiva/sclera: Conjunctivae normal.  Cardiovascular:     Rate and Rhythm: Normal rate and regular rhythm.     Heart sounds: Normal heart sounds.  Pulmonary:     Effort: Pulmonary effort is normal. No respiratory distress.     Breath sounds: No wheezing, rhonchi or rales.     Comments: Slightly diminished breath sounds throughout Musculoskeletal:     Cervical back: Neck supple.  Skin:    General: Skin is warm and dry.     Capillary Refill: Capillary refill takes less than 2 seconds.  Neurological:     General: No focal deficit present.     Mental Status: He is alert. Mental status is at baseline.     Motor: No weakness.     Coordination: Coordination normal.     Gait: Gait normal.  Psychiatric:        Mood and Affect: Mood normal.        Behavior: Behavior normal.        Thought Content: Thought content normal.     UC Treatments / Results  Labs (all labs ordered are listed, but only abnormal results are displayed) Labs Reviewed  GROUP A STREP BY PCR    EKG   Radiology DG Chest 2 View  Result Date: 05/30/2021 CLINICAL DATA:  One week history of cough. History of metastatic melanoma. EXAM: CHEST - 2  VIEW COMPARISON:  Chest x-ray 02/07/2019 FINDINGS: The cardiac silhouette, mediastinal and hilar contours are within normal limits. The lungs are clear of an acute process. No infiltrates or effusions. No pulmonary edema. There is a E the irregular density in the left mid lung which may have been present on the prior chest x-ray from 2020. This could be an area of scarring change. Chest CT suggested for further evaluation. The bony thorax is intact. IMPRESSION: 1. No acute cardiopulmonary findings. 2. Vague irregular density in the left mid lung. This may have been present on a prior chest x-ray from 2020. Recommend chest CT further evaluation. Electronically Signed   By: Marijo Sanes M.D.   On: 05/30/2021 09:31    Procedures Procedures (including critical care time)  Medications Ordered in UC Medications - No data to display  Initial Impression / Assessment and Plan / UC Course  I have reviewed the triage vital signs and the nursing notes.  Pertinent labs & imaging results that were available during my care of the patient were reviewed by me and considered in my medical decision making (see chart for details).  76 year old male with history of metastatic melanoma with tumor of left lung presently is presenting for 1 week history of cough, congestion, voice hoarseness and sore throat.  Cough is occasionally productive.  Patient is most concerned about his severe sore throat and voice hoarseness.  Vitals all normal and stable.  He is ill-appearing but nontoxic.  Voice is clearly hoarse.  On exam he does have erythema of posterior pharynx.  Chest clear to auscultation but mildly diminished voice sounds throughout all lung fields.  PCR strep is negative.  Chest x-ray ordered  to reassess for possible pneumonia.  Chest x-ray independently viewed by me.  Negative for infiltrates/pneumonia.  There is mention of a irregular density in the left midlung.  This likely corresponds to the tumor that he has  and is followed by oncology 4.  Discussed that with him.  Advised patient and wife symptoms still consistent with viral illness.  Supportive care encouraged with increasing rest and fluids, voice rest, throat lozenges, Chloraseptic spray.  Sent viscous lidocaine and Tussionex.  Reviewed controlled with database.  Patient not taking any pain medications.  Advised following up if not feeling better next week or for any new or worsening symptoms.  Advise going to ED for any severe acute changes.   Final Clinical Impressions(s) / UC Diagnoses   Final diagnoses:  Viral illness  Sore throat  Acute cough  Metastatic melanoma to lung, left [facility])     Discharge Instructions      -Strep test was negative. - The chest x-ray does not show any evidence of pneumonia. - Symptoms are likely still due to a virus.  Increase rest and fluids.  I have sent cough medication that you can take.  It does contain hydrocodone so it may make you a little sleepy if you take it during the day. - I have also sent viscous lidocaine to help numb your throat but you can use Chloraseptic spray and throat lozenges as well.  Try to rest your voice is much as possible and drink lots of water. - You should be seen again if not feeling better in the next week, fever, worsening sore throat, chest pain, breathing difficulty.  Please go to emergency department if severe acute worsening or symptoms.     ED Prescriptions     Medication Sig Dispense Auth. Provider   lidocaine (XYLOCAINE) 2 % solution Use as directed 15 mLs in the mouth or throat every 3 (three) hours as needed for mouth pain (swish and spit). 100 mL Laurene Footman B, PA-C   chlorpheniramine-HYDROcodone (TUSSIONEX PENNKINETIC ER) 10-8 MG/5ML SUER Take 5 mLs by mouth every 12 (twelve) hours as needed for cough. 70 mL Danton Clap, PA-C      PDMP not reviewed this encounter.   Danton Clap, PA-C 05/30/21 [phone]

## 2021-05-30 NOTE — Discharge Instructions (Signed)
-  Strep test was negative. - The chest x-ray does not show any evidence of pneumonia. - Symptoms are likely still due to a virus.  Increase rest and fluids.  I have sent cough medication that you can take.  It does contain hydrocodone so it may make you a little sleepy if you take it during the day. - I have also sent viscous lidocaine to help numb your throat but you can use Chloraseptic spray and throat lozenges as well.  Try to rest your voice is much as possible and drink lots of water. - You should be seen again if not feeling better in the next week, fever, worsening sore throat, chest pain, breathing difficulty.  Please go to emergency department if severe acute worsening or symptoms.

## 2021-06-01 ENCOUNTER — Other Ambulatory Visit: Payer: Self-pay | Admitting: Family Medicine

## 2021-06-02 NOTE — Telephone Encounter (Signed)
Requested Prescriptions  Pending Prescriptions Disp Refills   simvastatin (ZOCOR) 40 MG tablet [Pharmacy Med Name: SIMVASTATIN 40 MG TAB] 30 tablet 2    Sig: TAKE 1 TABLET BY MOUTH DAILY     Cardiovascular:  Antilipid - Statins Failed - 06/01/2021  2:31 PM      Failed - Total Cholesterol in normal range and within 360 days    Cholesterol, Total  Date Value Ref Range Status  10/19/2020 227 (H) 100 - 199 mg/dL Final         Failed - LDL in normal range and within 360 days    LDL Chol Calc (NIH)  Date Value Ref Range Status  10/19/2020 130 (H) 0 - 99 mg/dL Final         Passed - HDL in normal range and within 360 days    HDL  Date Value Ref Range Status  10/19/2020 80 >39 mg/dL Final         Passed - Triglycerides in normal range and within 360 days    Triglycerides  Date Value Ref Range Status  10/19/2020 97 0 - 149 mg/dL Final         Passed - Patient is not pregnant      Passed - Valid encounter within last 12 months    Recent Outpatient Visits          1 month ago Melanoma metastatic to lung, unspecified laterality [facility])   [place] Jerrol Banana., MD   7 months ago Medicare annual wellness visit, subsequent   [facility] Jerrol Banana., MD   1 year ago Pure hypercholesterolemia   [facility] Jerrol Banana., MD   1 year ago Pure hypercholesterolemia   [facility] Jerrol Banana., MD   2 years ago Encounter for annual physical exam   [facility] Jerrol Banana., MD

## 2021-06-27 ENCOUNTER — Encounter (HOSPITAL_COMMUNITY): Payer: Self-pay | Admitting: Radiology

## 2021-09-29 ENCOUNTER — Telehealth: Payer: Self-pay | Admitting: Family Medicine

## 2021-09-29 NOTE — Telephone Encounter (Unsigned)
Pt is calling to reschedule his AWV [id number]) [phone] ?

## 2021-10-25 ENCOUNTER — Encounter: Payer: Medicare Other | Admitting: Family Medicine

## 2021-11-03 ENCOUNTER — Ambulatory Visit (INDEPENDENT_AMBULATORY_CARE_PROVIDER_SITE_OTHER): Payer: Medicare Other | Admitting: Family Medicine

## 2021-11-03 ENCOUNTER — Encounter: Payer: Self-pay | Admitting: Family Medicine

## 2021-11-03 VITALS — BP 128/80 | HR 53 | Temp 97.6°F | Resp 16 | Ht 71.0 in | Wt 162.6 lb

## 2021-11-03 DIAGNOSIS — K219 Gastro-esophageal reflux disease without esophagitis: Secondary | ICD-10-CM

## 2021-11-03 DIAGNOSIS — C4A9 Merkel cell carcinoma, unspecified: Secondary | ICD-10-CM | POA: Diagnosis not present

## 2021-11-03 DIAGNOSIS — C7800 Secondary malignant neoplasm of unspecified lung: Secondary | ICD-10-CM | POA: Diagnosis not present

## 2021-11-03 DIAGNOSIS — E559 Vitamin D deficiency, unspecified: Secondary | ICD-10-CM

## 2021-11-03 DIAGNOSIS — Z0000 Encounter for general adult medical examination without abnormal findings: Secondary | ICD-10-CM

## 2021-11-03 DIAGNOSIS — E7800 Pure hypercholesterolemia, unspecified: Secondary | ICD-10-CM | POA: Diagnosis not present

## 2021-11-03 NOTE — Progress Notes (Signed)
I,Jana Robinson,acting as a scribe for Wilhemena Durie, MD.,have documented all relevant documentation on the behalf of Wilhemena Durie, MD,as directed by  Wilhemena Durie, MD while in the presence of Wilhemena Durie, MD.   Annual Wellness Visit     Patient: Marc Munoz, Male    DOB: 1945/10/03, 76 y.o.   MRN: [id number] Visit Date: 11/03/2021  Today's Provider: Wilhemena Durie, MD   Chief Complaint  Patient presents with   Annual Exam   Subjective    Marc Munoz is a 76 y.o. male who presents today for his Annual Wellness Visit. He reports consuming a general diet. Gym/ health club routine includes tennis. He generally feels well. He reports sleeping well. He does not have additional problems to discuss today.  He just wanted another national/regional tennis tournament this past week.  He remains very active and is feeling well despite his metastatic disease.      Medications: Outpatient Medications Prior to Visit  Medication Sig   aspirin EC 81 MG tablet Take 81 mg by mouth daily.   benzonatate (TESSALON) 100 MG capsule Take 1 capsule (100 mg total) by mouth 3 (three) times daily as needed for cough.   calcium-vitamin D (CALCIUM 500/D) 500-200 MG-UNIT tablet Take 1 tablet by mouth daily.   chlorpheniramine-HYDROcodone (TUSSIONEX PENNKINETIC ER) 10-8 MG/5ML SUER Take 5 mLs by mouth every 12 (twelve) hours as needed for cough.   Cholecalciferol (VITAMIN D3) 10 MCG (400 UNIT) CAPS Take 1 capsule by mouth daily.   COVID-19 mRNA bivalent vaccine, Pfizer, (PFIZER COVID-19 VAC BIVALENT) injection Inject into the muscle.   COVID-19 mRNA Vac-TriS, Pfizer, (PFIZER-BIONT COVID-19 VAC-TRIS) SUSP injection Inject into the muscle.   lidocaine (XYLOCAINE) 2 % solution Use as directed 15 mLs in the mouth or throat every 3 (three) hours as needed for mouth pain (swish and spit).   Multiple Vitamins tablet Take 1 tablet by mouth daily.    nivolumab 480 mg in sodium  chloride 0.9 % 100 mL Inject 480 mg into the vein.   Nutritional Supplements (IMMUNE ENHANCE) TABS Take 1 tablet by mouth daily.    omeprazole (PRILOSEC) 20 MG capsule Take 1 capsule (20 mg total) by mouth every morning.   simvastatin (ZOCOR) 40 MG tablet TAKE 1 TABLET BY MOUTH DAILY   tamsulosin (FLOMAX) 0.4 MG CAPS capsule TAKE 1 CAPSULE BY MOUTH ONCE DAILY   No facility-administered medications prior to visit.    No Known Allergies  Patient Care Team: Jerrol Banana., MD as PCP - General (Family Medicine)  Review of Systems  All other systems reviewed and are negative.   Last lipids Lab Results  Component Value Date   CHOL 227 (H) 10/19/2020   HDL 80 10/19/2020   LDLCALC 130 (H) 10/19/2020   TRIG 97 10/19/2020   CHOLHDL 2.8 10/19/2020        Objective    Vitals: BP 128/80 (BP Location: Right Arm, Patient Position: Sitting, Cuff Size: Normal)   Pulse (!) 53   Temp 97.6 F (36.4 C) (Oral)   Resp 16   Ht '5\' 11"'$  (1.803 m)   Wt 162 lb 9.6 oz (73.8 kg)   SpO2 100%   BMI 22.68 kg/m  BP Readings from Last 3 Encounters:  11/03/21 128/80  05/30/21 113/67  05/28/21 132/70   Wt Readings from Last 3 Encounters:  11/03/21 162 lb 9.6 oz (73.8 kg)  04/20/21 158 lb (71.7 kg)  10/18/20 162  lb (73.5 kg)       Physical Exam Vitals reviewed.  Constitutional:      Appearance: He is well-developed.  HENT:     Head: Normocephalic and atraumatic.     Right Ear: External ear normal.     Left Ear: External ear normal.     Nose: Nose normal.  Eyes:     General: No scleral icterus.    Conjunctiva/sclera: Conjunctivae normal.     Pupils: Pupils are equal, round, and reactive to light.  Neck:     Thyroid: No thyromegaly.  Cardiovascular:     Rate and Rhythm: Normal rate and regular rhythm.     Heart sounds: Normal heart sounds.  Pulmonary:     Effort: Pulmonary effort is normal.     Breath sounds: Normal breath sounds.  Abdominal:     Palpations: Abdomen is soft.   Lymphadenopathy:     Cervical: No cervical adenopathy.  Skin:    General: Skin is warm and dry.     Comments: Fair skin.  Neurological:     General: No focal deficit present.     Mental Status: He is alert and oriented to person, place, and time.  Psychiatric:        Mood and Affect: Mood normal.        Behavior: Behavior normal.        Thought Content: Thought content normal.        Judgment: Judgment normal.      Most recent functional status assessment:    11/03/2021    2:48 PM  In your present state of health, do you have any difficulty performing the following activities:  Hearing? 1  Vision? 0  Difficulty concentrating or making decisions? 0  Walking or climbing stairs? 0  Dressing or bathing? 0  Doing errands, shopping? 0   Most recent fall risk assessment:    11/03/2021    2:47 PM  [place] in the past year? 0  Number falls in past yr: 0  Injury with Fall? 0  Follow up Falls evaluation completed    Most recent depression screenings:    11/03/2021    2:47 PM 04/20/2021    8:31 AM  PHQ 2/9 Scores  PHQ - 2 Score 0 0  PHQ- 9 Score 0 0   Most recent cognitive screening:    10/16/2019    9:43 AM  6CIT Screen  What Year? 0 points  What month? 0 points  What time? 0 points  Count back from 20 0 points  Months in reverse 0 points  Repeat phrase 6 points  Total Score 6 points   Most recent Audit-C alcohol use screening    11/03/2021    2:48 PM  Alcohol Use Disorder Test (AUDIT)  1. How often do you have a drink containing alcohol? 1  2. How many drinks containing alcohol do you have on a typical day when you are drinking? 0  3. How often do you have six or more drinks on one occasion? 0  AUDIT-C Score 1   A score of 3 or more in women, and 4 or more in men indicates increased risk for alcohol abuse, EXCEPT if all of the points are from question 1   No results found for any visits on 11/03/21.  Assessment & Plan     Annual wellness visit  done today including the all of the following: Reviewed patient's Family Medical History Reviewed and updated list  of patient's medical providers Assessment of cognitive impairment was done Assessed patient's functional ability Established a written schedule for health screening services Health Risk Assessent Completed and Reviewed  Exercise Activities and Dietary recommendations  Goals   None     Immunization History  Administered Date(s) Administered   Fluad Quad(high Dose 65+) 02/25/2019, 02/05/2020, 02/02/2021   Influenza, High Dose Seasonal PF 03/11/2016   PFIZER Comirnaty(Gray Top)Covid-19 Tri-Sucrose Vaccine 07/04/2019, 07/25/2019, 09/02/2020   PFIZER(Purple Top)SARS-COV-2 Vaccination 03/08/2020   Pfizer Covid-19 Vaccine Bivalent Booster 18yr & up 02/22/2021   Pneumococcal Conjugate-13 07/08/2014   Pneumococcal Polysaccharide-23 06/11/2012   Tdap 06/11/2012   Zoster, Live 06/11/2012    Health Maintenance  Topic Date Due   Zoster Vaccines- Shingrix (1 of 2) Never done   INFLUENZA VACCINE  12/27/2021   TETANUS/TDAP  06/11/2022   COLONOSCOPY (Pts 45-458yrInsurance coverage will need to be confirmed)  11/20/2024   Pneumonia Vaccine 6552Years old  Completed   COVID-19 Vaccine  Completed   Hepatitis C Screening  Completed   HPV VACCINES  Aged Out     Discussed health benefits of physical activity, and encouraged him to engage in regular exercise appropriate for his age and condition.    1. Medicare annual wellness visit, subsequent   2. Pure hypercholesterolemia On statin - Lipid panel  3. Gastroesophageal reflux disease, unspecified whether esophagitis present   4. Merkel cell cancer ([place] months by otology  5. Melanoma metastatic to lung, unspecified laterality ([facility]Followed by oncology  6. Avitaminosis D    No follow-ups on file.     I, RiWilhemena DurieMD, have reviewed all documentation for this visit. The documentation  on 11/14/21 for the exam, diagnosis, procedures, and orders are all accurate and complete.    Odel Schmid GiCranford MonMD  [phone]) [phone])  [place]

## 2022-01-20 IMAGING — CR DG CHEST 2V
3 series · 3 of 3 positions shown · non-contrast
Comparison: Chest x-ray 02/07/2019

CLINICAL DATA: One week history of cough. History of metastatic
melanoma.

EXAM:
CHEST - 2 VIEW

[chest pa (1 of 2)]
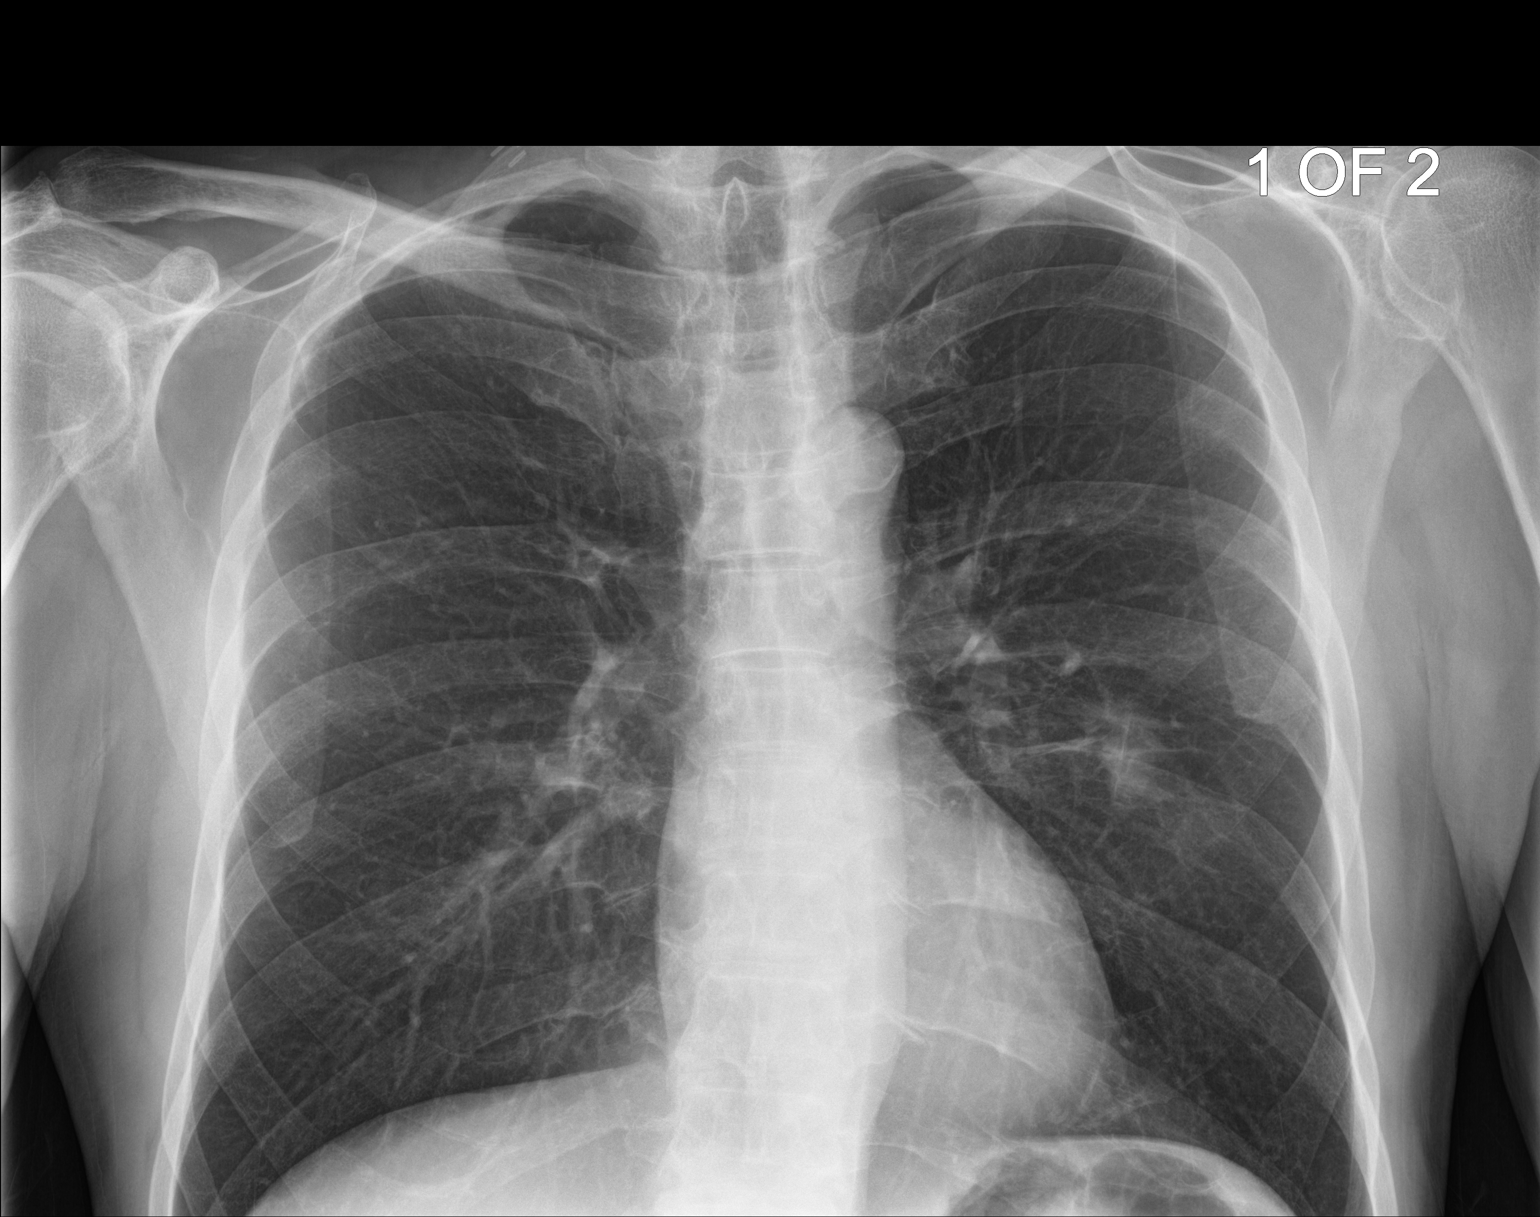

[chest lat]
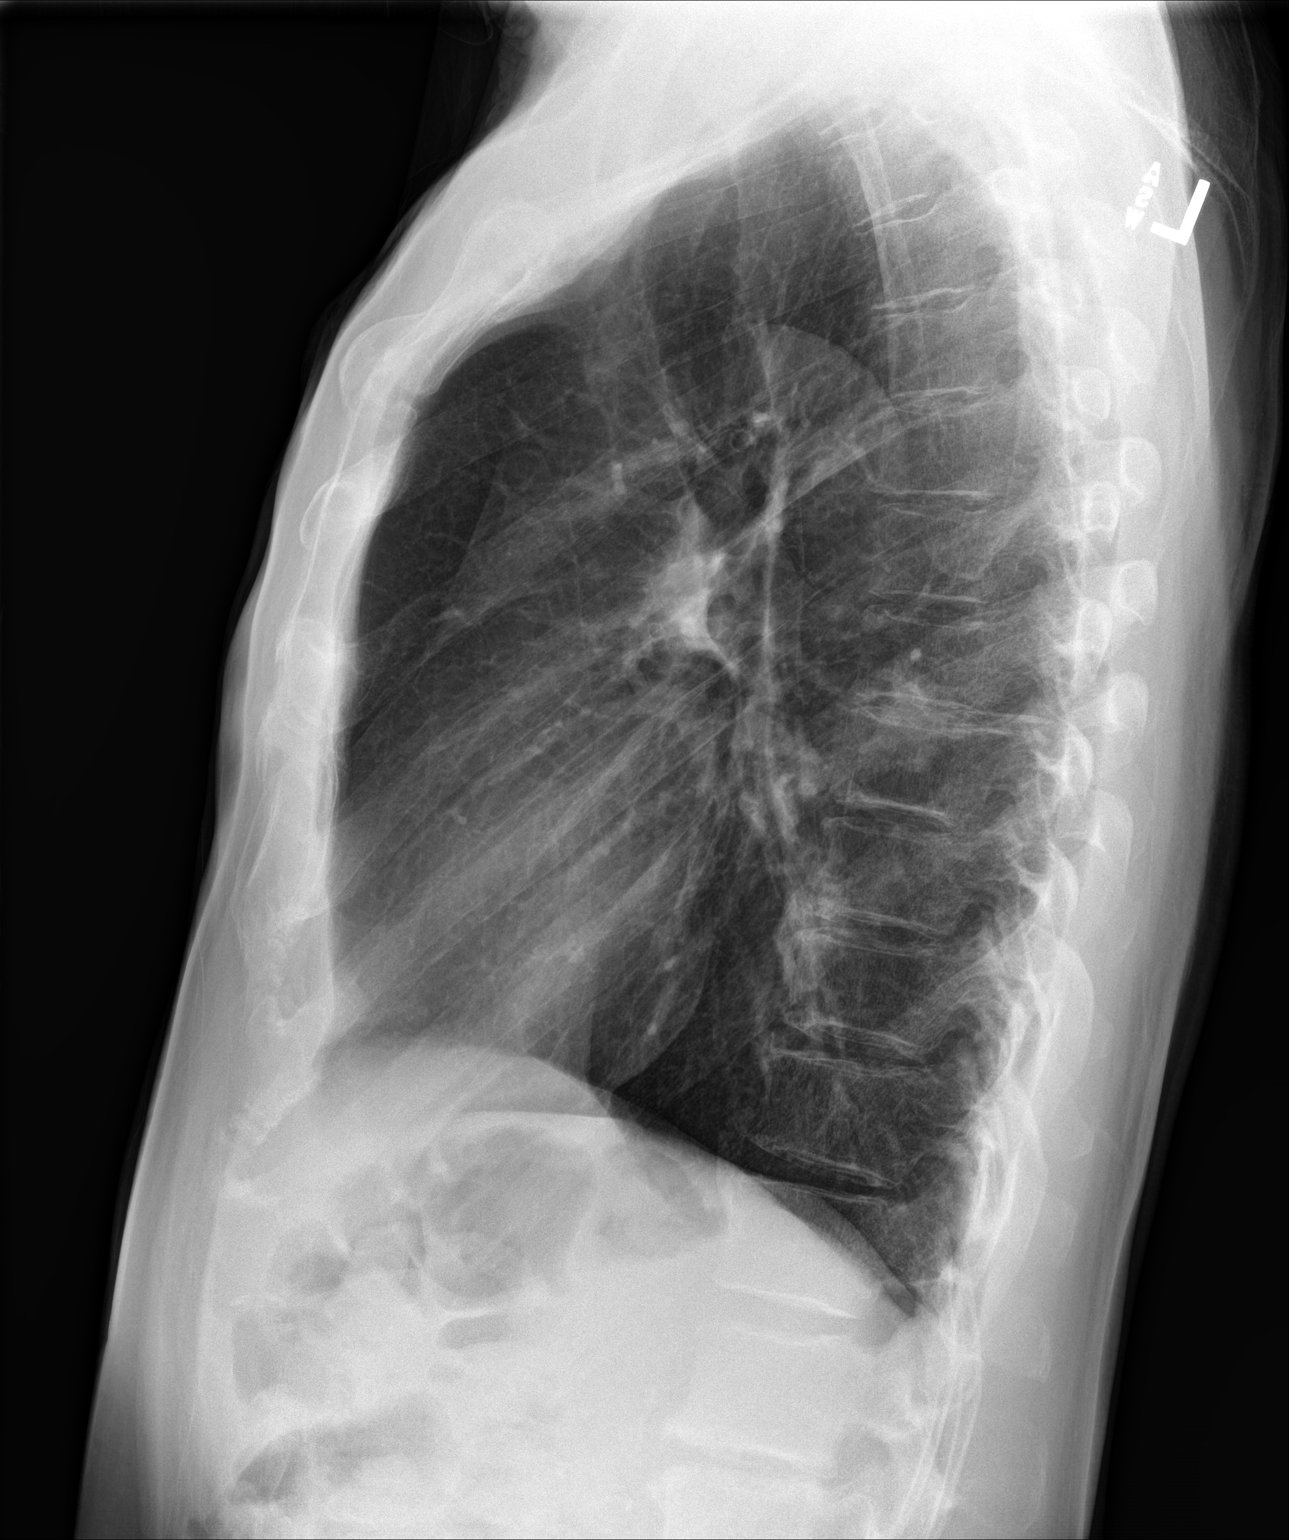

[chest pa (2 of 2)]
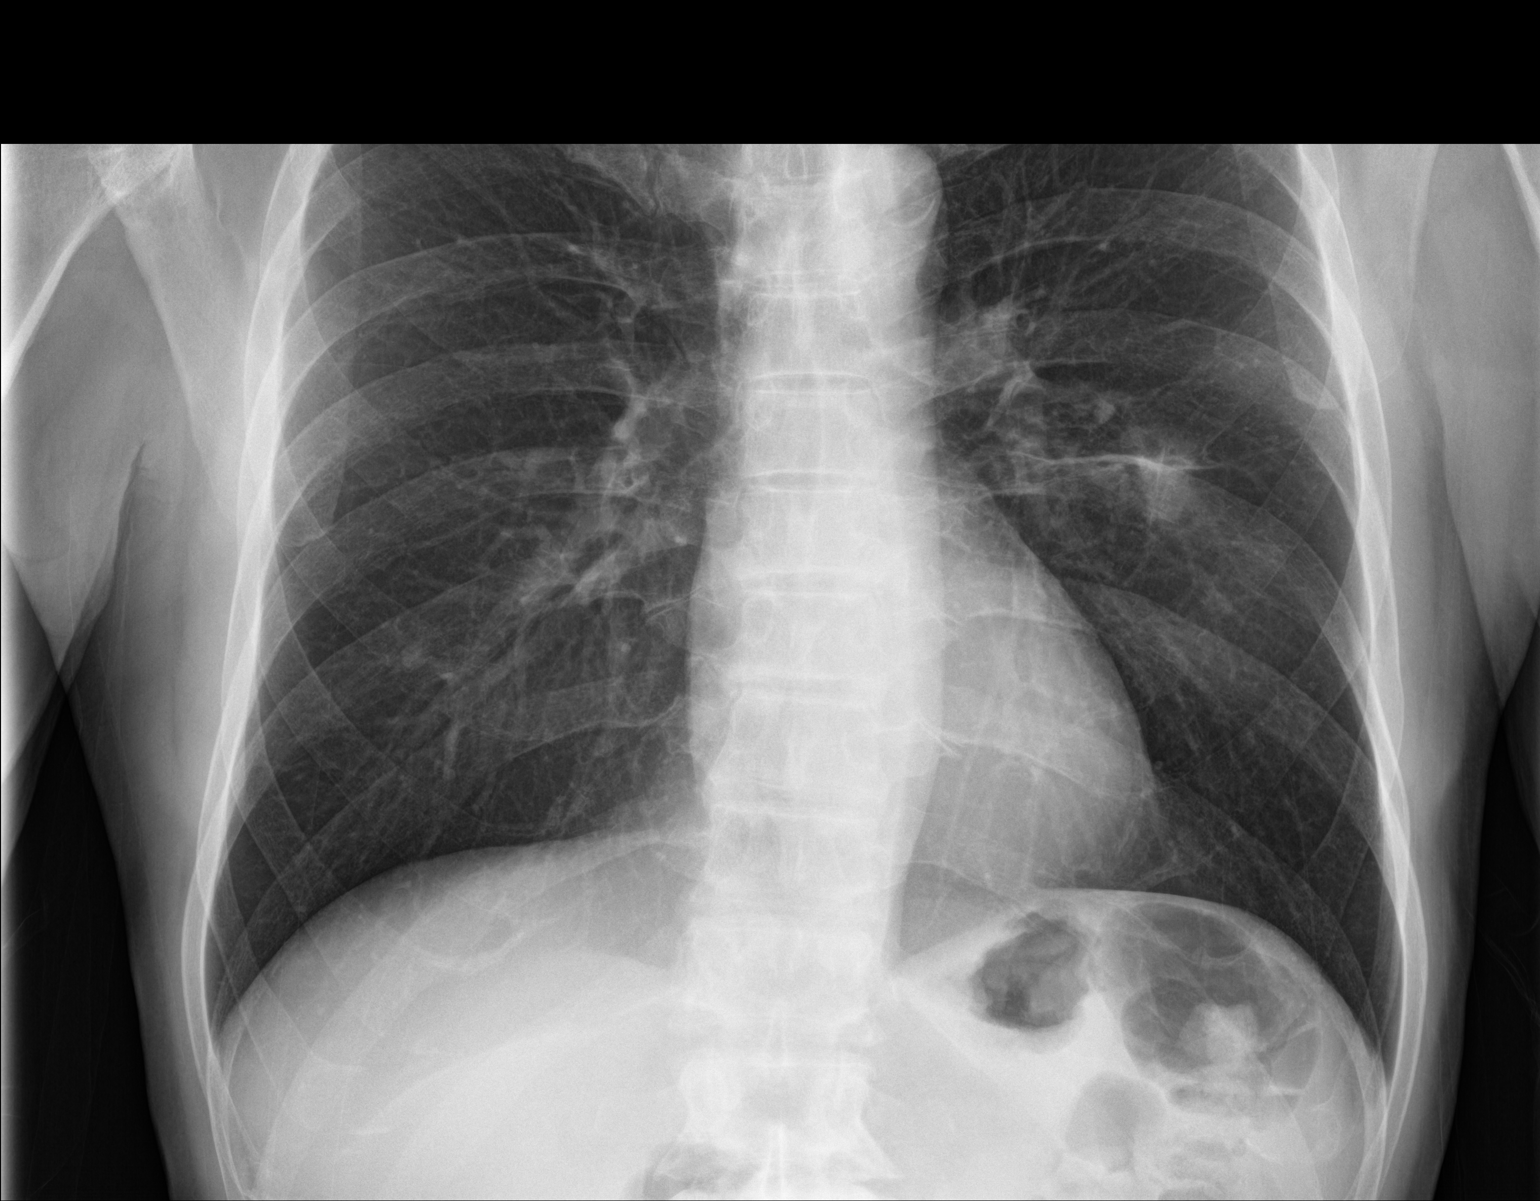

[3 of 3 positions shown; findings below may reference images not displayed]

FINDINGS: The cardiac silhouette, mediastinal and hilar contours are within
normal limits. The lungs are clear of an acute process. No
infiltrates or effusions. No pulmonary edema. There is a E the
irregular density in the left mid lung which may have been present
on the prior chest x-ray from 9999. This could be an area of
scarring change. Chest CT suggested for further evaluation. The bony
thorax is intact.
IMPRESSION: 1. No acute cardiopulmonary findings.
2. Vague irregular density in the left mid lung. This may have been
present on a prior chest x-ray from 9999. Recommend chest CT further
evaluation.

## 2022-01-23 ENCOUNTER — Other Ambulatory Visit: Payer: Self-pay | Admitting: Family Medicine

## 2022-01-23 DIAGNOSIS — N400 Enlarged prostate without lower urinary tract symptoms: Secondary | ICD-10-CM

## 2022-01-24 ENCOUNTER — Telehealth: Payer: Self-pay

## 2022-01-24 ENCOUNTER — Other Ambulatory Visit: Payer: Self-pay

## 2022-01-24 NOTE — Telephone Encounter (Signed)
They are printed and at the front for pickup

## 2022-01-24 NOTE — Telephone Encounter (Signed)
Copied from [place] [id number]. Topic: General - Other >> Jan 24, 2022 11:19 AM Ludger Nutting wrote: Patient is going to come by the office to pick up a copy of his [facility] orders.

## 2022-01-26 ENCOUNTER — Other Ambulatory Visit: Payer: Self-pay | Admitting: *Deleted

## 2022-01-26 DIAGNOSIS — Z125 Encounter for screening for malignant neoplasm of prostate: Secondary | ICD-10-CM

## 2022-01-26 DIAGNOSIS — N400 Enlarged prostate without lower urinary tract symptoms: Secondary | ICD-10-CM

## 2022-01-27 LAB — LIPID PANEL
Chol/HDL Ratio: 2.5 ratio (ref 0.0–5.0)
Cholesterol, Total: 196 mg/dL (ref 100–199)
HDL: 80 mg/dL (ref >39)
LDL Chol Calc (NIH): 101 mg/dL — ABNORMAL HIGH (ref 0–99)
Triglycerides: 82 mg/dL (ref 0–149)
VLDL Cholesterol Cal: 15 mg/dL (ref 5–40)

## 2022-01-27 LAB — PSA: Prostate Specific Ag, Serum: 4.4 ng/mL — ABNORMAL HIGH (ref 0.0–4.0)

## 2022-02-02 ENCOUNTER — Telehealth: Payer: Self-pay

## 2022-02-02 NOTE — Telephone Encounter (Signed)
Copied from [place] [id number]. Topic: Referral - Question >> Feb 02, 2022  1:33 PM Penni Bombard wrote: Reason for CRM: Pt called wanting to know if he can be referred to Kendrick Fries at [facility].  CB@  [phone]

## 2022-02-02 NOTE — Telephone Encounter (Signed)
Please advise 

## 2022-02-02 NOTE — Telephone Encounter (Signed)
ok 

## 2022-02-03 ENCOUNTER — Other Ambulatory Visit: Payer: Self-pay | Admitting: *Deleted

## 2022-02-03 DIAGNOSIS — R972 Elevated prostate specific antigen [PSA]: Secondary | ICD-10-CM

## 2022-02-03 DIAGNOSIS — N400 Enlarged prostate without lower urinary tract symptoms: Secondary | ICD-10-CM

## 2022-02-03 NOTE — Telephone Encounter (Signed)
Referral placed.

## 2022-02-21 ENCOUNTER — Other Ambulatory Visit: Payer: Self-pay | Admitting: *Deleted

## 2022-02-21 ENCOUNTER — Encounter: Payer: Self-pay | Admitting: Urology

## 2022-02-21 ENCOUNTER — Other Ambulatory Visit
Admission: RE | Admit: 2022-02-21 | Discharge: 2022-02-21 | Disposition: A | Discharge status: Home / Self Care | Payer: Medicare Other | Attending: Urology | Admitting: Urology

## 2022-02-21 ENCOUNTER — Ambulatory Visit (INDEPENDENT_AMBULATORY_CARE_PROVIDER_SITE_OTHER): Payer: Medicare Other | Admitting: Urology

## 2022-02-21 VITALS — BP 144/70 | HR 56 | Ht 71.0 in | Wt 166.0 lb

## 2022-02-21 DIAGNOSIS — N400 Enlarged prostate without lower urinary tract symptoms: Secondary | ICD-10-CM | POA: Diagnosis present

## 2022-02-21 DIAGNOSIS — R972 Elevated prostate specific antigen [PSA]: Secondary | ICD-10-CM | POA: Diagnosis not present

## 2022-02-21 DIAGNOSIS — N401 Enlarged prostate with lower urinary tract symptoms: Secondary | ICD-10-CM

## 2022-02-21 DIAGNOSIS — Z125 Encounter for screening for malignant neoplasm of prostate: Secondary | ICD-10-CM

## 2022-02-21 DIAGNOSIS — R351 Nocturia: Secondary | ICD-10-CM | POA: Diagnosis not present

## 2022-02-21 LAB — URINALYSIS, COMPLETE (UACMP) WITH MICROSCOPIC
Bacteria, UA: NONE SEEN
Bilirubin Urine: NEGATIVE
Glucose, UA: NEGATIVE mg/dL
Hgb urine dipstick: NEGATIVE
Ketones, ur: NEGATIVE mg/dL
Leukocytes,Ua: NEGATIVE
Nitrite: NEGATIVE
Protein, ur: NEGATIVE mg/dL
RBC / HPF: NONE SEEN RBC/hpf (ref 0–5)
Specific Gravity, Urine: 1.01 (ref 1.005–1.030)
Squamous Epithelial / HPF: NONE SEEN (ref 0–5)
pH: 7 (ref 5.0–8.0)

## 2022-02-21 LAB — BLADDER SCAN AMB NON-IMAGING

## 2022-02-21 NOTE — Progress Notes (Signed)
   02/21/22 8:49 AM   Marc Munoz [birth year] [id number]  CC: PSA screening, nocturia/BPH  HPI: 76 year old male who is otherwise very healthy aside from metastatic melanoma managed by oncology at [facility].  He presents for a very mildly elevated PSA of 4.4 which is essentially been stable over the last few years, including 4.0 last year, and 3.8 3 years ago.  He also has nocturia 3-4 times at night, and has been on Flomax for at least a few years.  He denies any urinary complaints during the day.  No dysuria or gross hematuria.  Urinalysis today benign, and PVR normal at 154m.   PMH: Past Medical History:  Diagnosis Date   [place]    [place]an[place]   Hyperlipemia     Surgical History: Past Surgical History:  Procedure Laterality Date   COLONOSCOPY N/A 05/03/2015   Procedure: COLONOSCOPY;  Surgeon: RManya Silvas MD;  [facility]  Service: Endoscopy;  Laterality: N/A;   HERNIA REPAIR     MELANOMA EXCISION      Family History: Family History  Problem Relation Age of Onset   Stroke Brother    Heart disease Mother    Hypertension Mother    Hypertension Father    Arthritis Father    COPD Father    Colon cancer Father     Social History:  reports that he has never smoked. He has never been exposed to tobacco smoke. He has never used smokeless tobacco. He reports current alcohol use of about 1.0 standard drink of alcohol per week. He reports that he does not use drugs.  Physical Exam: BP (!) 144/70   Pulse (!) 56   Ht '5\' 11"'$  (1.803 m)   Wt 166 lb (75.3 kg)   BMI 23.15 kg/m    Constitutional:  Alert and oriented, No acute distress. Cardiovascular: No clubbing, cyanosis, or edema. Respiratory: Normal respiratory effort, no increased work of breathing. GI: Abdomen is soft, nontender, nondistended, no abdominal masses DRE: 70 g, smooth, no nodules or masses  Laboratory Data: Reviewed, see HPI  Pertinent Imaging: I have personally viewed and interpreted  [facility] showing no hydronephrosis, stones, and prostate measures 77g  Assessment & Plan:   77year old male who is very healthy and active aside [facility]  Reassurance was provided regarding his stable PSA that is essentially normal for his age, and very reassuring PSA density of 0.06.  We also discussed that he is not an ideal candidate for PSA screening in the setting of both his age and his metastatic melanoma.  In terms of his urinary symptoms, we focused on behavioral strategies including minimizing fluids before bedtime, avoiding bladder irritants, and double voiding prior to bed.  Other alternatives would be addition of finasteride, changed to a different alpha-blocker, or consideration of cystoscopy to consider outlet procedures.   -Continue Flomax, behavioral strategies discussed regarding nocturia -RTC 4 months PVR, symptom check.  Consider addition of finasteride or change to a different alpha-blocker if persistent symptoms  BNickolas Madrid MD 02/21/2022  [facility] [address] [place] [place] [phone]

## 2022-02-21 NOTE — Patient Instructions (Signed)
Nocturia refers to the need to wake up during the night to urinate, which can disrupt your sleep and impact your overall well-being. Fortunately, there are several strategies you can employ to help prevent or manage nocturia. It's important to consult with your healthcare provider before making any significant changes to your routine. Here are some helpful strategies to consider:  Limit Fluid Intake Before Bed: Avoid drinking large amounts of fluids in the evening, especially within a few hours of bedtime. Consume most of your daily fluid intake earlier in the day to reduce the need to urinate at night.  Monitor Your Diet: Limit your intake of caffeine and alcohol, as these substances can increase urine production and irritate the bladder.  Avoid diet, "zero calorie," and artificially sweetened drinks, especially sodas, in the afternoon or evening. Be mindful of consuming foods and drinks with high water content before bedtime, such as watermelon and herbal teas.  Time Your Medications: If you're taking medications that contribute to increased urination, consult your healthcare provider about adjusting the timing of these medications to minimize their impact during the night.  Practice Double Voiding: Before going to bed, make an effort to empty your bladder twice within a short period. This can help reduce the amount of urine left in your bladder before sleep.  Bladder Training: Gradually increase the time between bathroom visits during the day to train your bladder to hold larger volumes of urine. Over time, this can help reduce the frequency of nighttime awakenings to urinate.  Elevate Your Legs During the Day: Elevating your legs during the day can help minimize fluid retention in your lower extremities, which might reduce nighttime urination.  Pelvic Floor Exercises: Strengthening your pelvic floor muscles through Kegel exercises can help improve bladder control and potentially reduce  the urge to urinate at night.  Create a Relaxing Bedtime Routine: Stress and anxiety can exacerbate nocturia. Engage in calming activities before bed, such as reading, listening to soothing music, or practicing relaxation techniques.  Stay Active: Engage in regular physical activity, but avoid intense exercise close to bedtime, as this can increase your body's demand for fluids.  Maintain a Healthy Weight: Excess weight can compress the bladder and contribute to bladder and urinary issues. Aim to achieve and maintain a healthy weight through a balanced diet and regular exercise.  Remember that every individual is unique, and the effectiveness of these strategies may vary. It's important to work with your healthcare provider to develop a plan that suits your specific needs and addresses any underlying causes of nocturia.    Prostate Cancer Screening  Prostate cancer screening is testing that is done to check for the presence of prostate cancer in men. The prostate gland is a walnut-sized gland that is located below the bladder and in front of the rectum in males. The function of the prostate is to add fluid to semen during ejaculation. Prostate cancer is one of the most common types of cancer in men. Who should have prostate cancer screening? Screening recommendations vary based on age and other risk factors, as well as between the professional organizations who make the recommendations. In general, screening is recommended if: You are age 50 to 70 and have an average risk for prostate cancer. You should talk with your health care provider about your need for screening and how often screening should be done. Because most prostate cancers are slow growing and will not cause death, screening in this age group is generally reserved for men who   have a 10- to 15-year life expectancy. You are younger than age 50, and you have these risk factors: Having a father, brother, or uncle who has been  diagnosed with prostate cancer. The risk is higher if your family member's cancer occurred at an early age or if you have multiple family members with prostate cancer at an early age. Being a male who is Black or is of Caribbean or sub-Saharan African descent. In general, screening is not recommended if: You are younger than age 40. You are between the ages of 40 and 49 and you have no risk factors. You are 70 years of age or older. At this age, the risks that screening can cause are greater than the benefits that it may provide. If you are at high risk for prostate cancer, your health care provider may recommend that you have screenings more often or that you start screening at a younger age. How is screening for prostate cancer done? The recommended prostate cancer screening test is a blood test called the prostate-specific antigen (PSA) test. PSA is a protein that is made in the prostate. As you age, your prostate naturally produces more PSA. Abnormally high PSA levels may be caused by: Prostate cancer. An enlarged prostate that is not caused by cancer (benign prostatic hyperplasia, or BPH). This condition is very common in older men. A prostate gland infection (prostatitis) or urinary tract infection. Certain medicines such as male hormones (like testosterone) or other medicines that raise testosterone levels. A rectal exam may be done as part of prostate cancer screening to help provide information about the size of your prostate gland. When a rectal exam is performed, it should be done after the PSA level is drawn to avoid any effect on the results. Depending on the PSA results, you may need more tests, such as: A physical exam to check the size of your prostate gland, if not done as part of screening. Blood and imaging tests. A procedure to remove tissue samples from your prostate gland for testing (biopsy). This is the only way to know for certain if you have prostate cancer. What are the  benefits of prostate cancer screening? Screening can help to identify cancer at an early stage, before symptoms start and when the cancer can be treated more easily. There is a small chance that screening may lower your risk of dying from prostate cancer. The chance is small because prostate cancer is a slow-growing cancer, and most men with prostate cancer die from a different cause. What are the risks of prostate cancer screening? The main risk of prostate cancer screening is diagnosing and treating prostate cancer that would never have caused any symptoms or problems. This is called overdiagnosisand overtreatment. PSA screening cannot tell you if your PSA is high due to cancer or a different cause. A prostate biopsy is the only procedure to diagnose prostate cancer. Even the results of a biopsy may not tell you if your cancer needs to be treated. Slow-growing prostate cancer may not need any treatment other than monitoring, so diagnosing and treating it may cause unnecessary stress or other side effects. Questions to ask your health care provider When should I start prostate cancer screening? What is my risk for prostate cancer? How often do I need screening? What type of screening tests do I need? How do I get my test results? What do my results mean? Do I need treatment? Where to find more information The American Cancer Society: www.cancer.org American Urological   Association: www.auanet.org Contact a health care provider if: You have difficulty urinating. You have pain when you urinate or ejaculate. You have blood in your urine or semen. You have pain in your back or in the area of your prostate. Summary Prostate cancer is a common type of cancer in men. The prostate gland is located below the bladder and in front of the rectum. This gland adds fluid to semen during ejaculation. Prostate cancer screening may identify cancer at an early stage, when the cancer can be treated more easily  and is less likely to have spread to other areas of the body. The prostate-specific antigen (PSA) test is the recommended screening test for prostate cancer, but it has associated risks. Discuss the risks and benefits of prostate cancer screening with your health care provider. If you are age 70 or older, the risks that screening can cause are greater than the benefits that it may provide. This information is not intended to replace advice given to you by your health care provider. Make sure you discuss any questions you have with your health care provider. Document Revised: 11/08/2020 Document Reviewed: 11/08/2020 Elsevier Patient Education  2023 Elsevier Inc.  

## 2022-04-10 ENCOUNTER — Telehealth: Payer: Self-pay | Admitting: Family Medicine

## 2022-04-10 MED ORDER — SIMVASTATIN 40 MG PO TABS: 40.0000 mg | ORAL_TABLET | Freq: Every day | ORAL | 0 refills | Status: DC

## 2022-04-10 NOTE — Telephone Encounter (Signed)
[facility] faxed refill request for the following medications:   simvastatin (ZOCOR) 40 MG tablet    Please advise

## 2022-04-12 ENCOUNTER — Ambulatory Visit: Payer: Medicare Other | Admitting: Family Medicine

## 2022-05-05 ENCOUNTER — Ambulatory Visit (INDEPENDENT_AMBULATORY_CARE_PROVIDER_SITE_OTHER): Payer: Medicare Other | Admitting: Family Medicine

## 2022-05-05 ENCOUNTER — Encounter: Payer: Self-pay | Admitting: Family Medicine

## 2022-05-05 VITALS — BP 115/75 | HR 63 | Temp 99.2°F | Wt 164.1 lb

## 2022-05-05 DIAGNOSIS — J398 Other specified diseases of upper respiratory tract: Secondary | ICD-10-CM | POA: Diagnosis not present

## 2022-05-05 DIAGNOSIS — U071 COVID-19: Secondary | ICD-10-CM | POA: Diagnosis not present

## 2022-05-05 DIAGNOSIS — R972 Elevated prostate specific antigen [PSA]: Secondary | ICD-10-CM | POA: Insufficient documentation

## 2022-05-05 DIAGNOSIS — R7989 Other specified abnormal findings of blood chemistry: Secondary | ICD-10-CM | POA: Insufficient documentation

## 2022-05-05 MED ORDER — HYDROCOD POLI-CHLORPHE POLI ER 10-8 MG/5ML PO SUER: 5.0000 mL | Freq: Every evening | ORAL | 0 refills | Status: DC | PRN

## 2022-05-05 MED ORDER — MOLNUPIRAVIR 200 MG PO CAPS: 4.0000 | ORAL_CAPSULE | Freq: Two times a day (BID) | ORAL | 0 refills | Status: AC

## 2022-05-05 NOTE — Progress Notes (Signed)
I,Connie R Striblin,acting as a Education administrator for Gwyneth Sprout, FNP.,have documented all relevant documentation on the behalf of Gwyneth Sprout, FNP,as directed by  Gwyneth Sprout, FNP while in the presence of Gwyneth Sprout, FNP.   Established patient visit   Patient: Marc Munoz   DOB: Aug 20, 1945   76 y.o. Male  MRN: [id number] Visit Date: 05/05/2022  Today's healthcare provider: Gwyneth Sprout, FNP  Introduced to nurse practitioner role and practice setting.  All questions answered.  Discussed provider/patient relationship and expectations.  Chief Complaint  Patient presents with   Nasal Congestion   Subjective    HPI  Congestion : Patient complains of cough and congestion. Symptoms began 1 day ago.. Associated symptoms include chills, fever, nasal congestion, and sneezing.  Current treatments have included acetaminophen, with good improvement.    Pt tested positive for covid 1 day ago, wife is also COVID positive. Pt is vaccinated.    Medications: Outpatient Medications Prior to Visit  Medication Sig   calcium-vitamin D (CALCIUM 500/D) 500-200 MG-UNIT tablet Take 1 tablet by mouth daily.   Cholecalciferol (VITAMIN D3) 10 MCG (400 UNIT) CAPS Take 1 capsule by mouth daily.   Multiple Vitamins tablet Take 1 tablet by mouth daily.    nivolumab 480 mg in sodium chloride 0.9 % 100 mL Inject 480 mg into the vein.   Nutritional Supplements (IMMUNE ENHANCE) TABS Take 1 tablet by mouth daily.    simvastatin (ZOCOR) 40 MG tablet Take 1 tablet (40 mg total) by mouth daily.   tamsulosin (FLOMAX) 0.4 MG CAPS capsule TAKE 1 CAPSULE BY MOUTH ONCE DAILY   No facility-administered medications prior to visit.    Review of Systems    Objective    BP 115/75 (BP Location: Left Arm, Patient Position: Sitting, Cuff Size: Normal)   Pulse 63   Temp 99.2 F (37.3 C) (Oral)   Wt 164 lb 1.6 oz (74.4 kg)   SpO2 100%   BMI 22.89 kg/m   Physical Exam Vitals and nursing note reviewed.   Constitutional:      General: He is not in acute distress.    Appearance: Normal appearance. He is normal weight. He is not ill-appearing, toxic-appearing or diaphoretic.  HENT:     Head: Normocephalic and atraumatic.  Eyes:     Pupils: Pupils are equal, round, and reactive to light.  Cardiovascular:     Rate and Rhythm: Normal rate and regular rhythm.     Pulses: Normal pulses.     Heart sounds: Normal heart sounds.  Pulmonary:     Effort: Pulmonary effort is normal.     Breath sounds: Normal breath sounds.  Musculoskeletal:        General: Normal range of motion.     Cervical back: Normal range of motion.  Skin:    General: Skin is warm and dry.     Capillary Refill: Capillary refill takes less than 2 seconds.  Neurological:     General: No focal deficit present.     Mental Status: He is alert and oriented to person, place, and time. Mental status is at baseline.  Psychiatric:        Mood and Affect: Mood normal.        Behavior: Behavior normal.        Thought Content: Thought content normal.        Judgment: Judgment normal.     No results found for any visits on 05/05/22.  Assessment & Plan     Problem List Items Addressed This Visit       Respiratory   Congestion of upper respiratory tract    Continue to drink water daily.  Goal of 64 oz/day minimum.  Can use OTC allergy medication, ex Zyrtec as well as nasal steroid, ex Flonase.  If you are congested you can use Saline Nasal Spray.  Use saline prior to using Flonase if needed.  You can use medications for cough like Delsym and for mucus, like Mucinex.  Can add lozenges or drink tea with local honey to help relieve symptoms.       Relevant Medications   chlorpheniramine-HYDROcodone (TUSSIONEX) 10-8 MG/5ML     Other   COVID - Primary    No recent MP; will send for antiviral to assist      Relevant Medications   molnupiravir EUA (LAGEVRIO) 200 MG CAPS capsule   Return if symptoms worsen or fail to  improve.     Vonna Kotyk, FNP, have reviewed all documentation for this visit. The documentation on 05/05/22 for the exam, diagnosis, procedures, and orders are all accurate and complete.  Gwyneth Sprout, [place] [phone] (phone) [phone] (fax)  [place]

## 2022-05-05 NOTE — Assessment & Plan Note (Signed)
No recent MP; will send for antiviral to assist

## 2022-05-05 NOTE — Assessment & Plan Note (Signed)
Continue to drink water daily.  Goal of 64 oz/day minimum.  Can use OTC allergy medication, ex Zyrtec as well as nasal steroid, ex Flonase.  If you are congested you can use Saline Nasal Spray.  Use saline prior to using Flonase if needed.  You can use medications for cough like Delsym and for mucus, like Mucinex.  Can add lozenges or drink tea with local honey to help relieve symptoms.

## 2022-05-31 ENCOUNTER — Other Ambulatory Visit: Payer: Self-pay | Admitting: Family Medicine

## 2022-06-26 ENCOUNTER — Ambulatory Visit: Payer: Medicare Other | Admitting: Urology

## 2022-07-11 ENCOUNTER — Other Ambulatory Visit: Payer: Self-pay | Admitting: Family Medicine

## 2022-07-12 NOTE — Telephone Encounter (Signed)
Requested Prescriptions  Pending Prescriptions Disp Refills   simvastatin (ZOCOR) 40 MG tablet [Pharmacy Med Name: SIMVASTATIN 40 MG TAB] 90 tablet 0    Sig: TAKE 1 TABLET BY MOUTH ONCE DAILY     Cardiovascular:  Antilipid - Statins Failed - 07/11/2022  8:45 AM      Failed - Lipid Panel in normal range within the last 12 months    Cholesterol, Total  Date Value Ref Range Status  01/26/2022 196 100 - 199 mg/dL Final   LDL Chol Calc (NIH)  Date Value Ref Range Status  01/26/2022 101 (H) 0 - 99 mg/dL Final   HDL  Date Value Ref Range Status  01/26/2022 80 >39 mg/dL Final   Triglycerides  Date Value Ref Range Status  01/26/2022 82 0 - 149 mg/dL Final         Passed - Patient is not pregnant      Passed - Valid encounter within last 12 months    Recent Outpatient Visits           2 months ago [place] Gwyneth Sprout, FNP   8 months ago Medicare annual wellness visit, subsequent   [place] Eulas Post, MD   1 year ago Melanoma metastatic to lung, unspecified laterality [facility])   [place] Eulas Post, MD   1 year ago Medicare annual wellness visit, subsequent   [place] Eulas Post, MD   2 years ago Pure hypercholesterolemia   [place] Campbell, MD       Future Appointments             In 4 weeks Diamantina [place], Herbert Seta, MD [place]

## 2022-08-10 ENCOUNTER — Ambulatory Visit: Payer: Medicare Other | Admitting: Urology

## 2022-10-09 ENCOUNTER — Other Ambulatory Visit: Payer: Self-pay | Admitting: Family Medicine

## 2022-10-10 NOTE — Telephone Encounter (Signed)
Requested Prescriptions  Pending Prescriptions Disp Refills   simvastatin (ZOCOR) 40 MG tablet [Pharmacy Med Name: SIMVASTATIN 40 MG TAB] 90 tablet 0    Sig: TAKE 1 TABLET BY MOUTH ONCE DAILY     Cardiovascular:  Antilipid - Statins Failed - 10/09/2022 11:11 AM      Failed - Lipid Panel in normal range within the last 12 months    Cholesterol, Total  Date Value Ref Range Status  01/26/2022 196 100 - 199 mg/dL Final   LDL Chol Calc (NIH)  Date Value Ref Range Status  01/26/2022 101 (H) 0 - 99 mg/dL Final   HDL  Date Value Ref Range Status  01/26/2022 80 >39 mg/dL Final   Triglycerides  Date Value Ref Range Status  01/26/2022 82 0 - 149 mg/dL Final         Passed - Patient is not pregnant      Passed - Valid encounter within last 12 months    Recent Outpatient Visits           5 months ago COVID   [place] Jacky Kindle, FNP   11 months ago Medicare annual wellness visit, subsequent   [place] [facility] Bosie Clos, MD   1 year ago Melanoma metastatic to lung, unspecified laterality [facility])   [place] [facility] Bosie Clos, MD   1 year ago Medicare annual wellness visit, subsequent   [facility] Bosie Clos, MD   2 years ago Pure hypercholesterolemia   [facility] Bosie Clos, MD

## 2022-11-01 ENCOUNTER — Telehealth: Payer: Self-pay | Admitting: Family Medicine

## 2022-11-01 NOTE — Telephone Encounter (Signed)
Patient returned my call about scheduling AWV.  Patient said he followed Dr. Sullivan Lone to his new office.

## 2022-11-01 NOTE — Telephone Encounter (Signed)
Reviewed.  PCP has been updated in chart

## 2023-01-08 ENCOUNTER — Other Ambulatory Visit: Payer: Self-pay | Admitting: Family Medicine

## 2024-05-02 ENCOUNTER — Other Ambulatory Visit: Payer: Self-pay

## 2024-05-02 ENCOUNTER — Ambulatory Visit
Admission: RE | Admit: 2024-05-02 | Discharge: 2024-05-02 | Disposition: A | Discharge status: Home / Self Care | Attending: Internal Medicine | Admitting: Internal Medicine

## 2024-05-02 ENCOUNTER — Encounter: Admission: RE | Disposition: A | Payer: Self-pay | Source: Home / Self Care | Attending: Internal Medicine

## 2024-05-02 ENCOUNTER — Encounter: Payer: Self-pay | Admitting: Internal Medicine

## 2024-05-02 DIAGNOSIS — R9439 Abnormal result of other cardiovascular function study: Secondary | ICD-10-CM

## 2024-05-02 HISTORY — PX: RIGHT/LEFT HEART CATH AND CORONARY ANGIOGRAPHY: CATH118266

## 2024-05-02 LAB — POCT I-STAT 7, (LYTES, BLD GAS, ICA,H+H)
Acid-Base Excess: 2 mmol/L (ref 0.0–2.0)
Bicarbonate: 27.5 mmol/L (ref 20.0–28.0)
Calcium, Ion: 1.24 mmol/L (ref 1.15–1.40)
HCT: 40 % (ref 39.0–52.0)
Hemoglobin: 13.6 g/dL (ref 13.0–17.0)
O2 Saturation: 99 %
Potassium: 3.6 mmol/L (ref 3.5–5.1)
Sodium: 136 mmol/L (ref 135–145)
TCO2: 29 mmol/L (ref 22–32)
pCO2 arterial: 43.6 mmHg (ref 32–48)
pH, Arterial: 7.408 (ref 7.35–7.45)
pO2, Arterial: 130 mmHg — ABNORMAL HIGH (ref 83–108)

## 2024-05-02 LAB — POCT I-STAT EG7
Acid-Base Excess: 3 mmol/L — ABNORMAL HIGH (ref 0.0–2.0)
Bicarbonate: 28.9 mmol/L — ABNORMAL HIGH (ref 20.0–28.0)
Calcium, Ion: 1.26 mmol/L (ref 1.15–1.40)
HCT: 39 % (ref 39.0–52.0)
Hemoglobin: 13.3 g/dL (ref 13.0–17.0)
O2 Saturation: 69 %
Potassium: 3.6 mmol/L (ref 3.5–5.1)
Sodium: 136 mmol/L (ref 135–145)
TCO2: 30 mmol/L (ref 22–32)
pCO2, Ven: 49 mmHg (ref 44–60)
pH, Ven: 7.378 (ref 7.25–7.43)
pO2, Ven: 37 mmHg (ref 32–45)

## 2024-05-02 SURGERY — RIGHT/LEFT HEART CATH AND CORONARY ANGIOGRAPHY
Anesthesia: Moderate Sedation | Laterality: Bilateral

## 2024-05-02 MED ORDER — SODIUM CHLORIDE 0.9% FLUSH: 3.0000 mL | Freq: Two times a day (BID) | INTRAVENOUS | Status: DC

## 2024-05-02 MED ORDER — SODIUM CHLORIDE 0.9 % IV SOLN
250.0000 mL | INTRAVENOUS | Status: DC | PRN
  Administered 2024-05-02: 250 mL via INTRAVENOUS

## 2024-05-02 MED ORDER — FENTANYL CITRATE (PF) 100 MCG/2ML IJ SOLN
INTRAMUSCULAR | Status: DC | PRN
  Administered 2024-05-02: 50 ug via INTRAVENOUS

## 2024-05-02 MED ORDER — VERAPAMIL HCL 2.5 MG/ML IV SOLN
INTRAVENOUS | Status: AC
  Filled 2024-05-02: qty 2

## 2024-05-02 MED ORDER — FREE WATER: 500.0000 mL | Freq: Once | Status: DC

## 2024-05-02 MED ORDER — LABETALOL HCL 5 MG/ML IV SOLN: 10.0000 mg | INTRAVENOUS | Status: DC | PRN

## 2024-05-02 MED ORDER — LIDOCAINE HCL (PF) 1 % IJ SOLN
INTRAMUSCULAR | Status: DC | PRN
  Administered 2024-05-02 (×2): 2 mL

## 2024-05-02 MED ORDER — SODIUM CHLORIDE 0.9% FLUSH: 3.0000 mL | INTRAVENOUS | Status: DC | PRN

## 2024-05-02 MED ORDER — IOHEXOL 300 MG/ML  SOLN
INTRAMUSCULAR | Status: DC | PRN
  Administered 2024-05-02: 95 mL

## 2024-05-02 MED ORDER — HEPARIN SODIUM (PORCINE) 1000 UNIT/ML IJ SOLN
INTRAMUSCULAR | Status: DC | PRN
  Administered 2024-05-02: 3500 [IU] via INTRAVENOUS

## 2024-05-02 MED ORDER — ASPIRIN 81 MG PO CHEW: 81.0000 mg | CHEWABLE_TABLET | ORAL | Status: DC

## 2024-05-02 MED ORDER — MIDAZOLAM HCL (PF) 2 MG/2ML IJ SOLN
INTRAMUSCULAR | Status: DC | PRN
  Administered 2024-05-02: 1 mg via INTRAVENOUS

## 2024-05-02 MED ORDER — HEPARIN (PORCINE) IN NACL 1000-0.9 UT/500ML-% IV SOLN
INTRAVENOUS | Status: DC | PRN
  Administered 2024-05-02: 1000 mL

## 2024-05-02 MED ORDER — HYDRALAZINE HCL 20 MG/ML IJ SOLN
INTRAMUSCULAR | Status: AC
  Filled 2024-05-02: qty 1

## 2024-05-02 MED ORDER — FENTANYL CITRATE (PF) 100 MCG/2ML IJ SOLN
INTRAMUSCULAR | Status: AC
  Filled 2024-05-02: qty 2

## 2024-05-02 MED ORDER — MIDAZOLAM HCL 2 MG/2ML IJ SOLN
INTRAMUSCULAR | Status: AC
  Filled 2024-05-02: qty 2

## 2024-05-02 MED ORDER — ONDANSETRON HCL 4 MG/2ML IJ SOLN: 4.0000 mg | Freq: Four times a day (QID) | INTRAMUSCULAR | Status: DC | PRN

## 2024-05-02 MED ORDER — LIDOCAINE HCL 1 % IJ SOLN
INTRAMUSCULAR | Status: AC
  Filled 2024-05-02: qty 20

## 2024-05-02 MED ORDER — HYDRALAZINE HCL 20 MG/ML IJ SOLN
10.0000 mg | INTRAMUSCULAR | Status: DC | PRN
  Administered 2024-05-02: 10 mg via INTRAVENOUS

## 2024-05-02 MED ORDER — SODIUM CHLORIDE 0.9 % IV SOLN: 250.0000 mL | INTRAVENOUS | Status: DC | PRN

## 2024-05-02 MED ORDER — ACETAMINOPHEN 325 MG PO TABS: 650.0000 mg | ORAL_TABLET | ORAL | Status: DC | PRN

## 2024-05-02 MED ORDER — HEPARIN SODIUM (PORCINE) 1000 UNIT/ML IJ SOLN
INTRAMUSCULAR | Status: AC
  Filled 2024-05-02: qty 10

## 2024-05-02 MED ORDER — HEPARIN (PORCINE) IN NACL 1000-0.9 UT/500ML-% IV SOLN
INTRAVENOUS | Status: AC
  Filled 2024-05-02: qty 1000

## 2024-05-02 SURGICAL SUPPLY — 11 items
CATH BALLN WEDGE 5F 110CM (CATHETERS) IMPLANT
CATH INFINITI 5 FR JL3.5 (CATHETERS) IMPLANT
CATH INFINITI JR4 5F (CATHETERS) IMPLANT
DEVICE RAD TR BAND REGULAR (VASCULAR PRODUCTS) IMPLANT
DRAPE BRACHIAL (DRAPES) IMPLANT
GLIDESHEATH SLEND SS 6F .021 (SHEATH) IMPLANT
GUIDEWIRE INQWIRE 1.5J.035X260 (WIRE) IMPLANT
PACK CARDIAC CATH (CUSTOM PROCEDURE TRAY) ×1 IMPLANT
SET ATX-X65L (MISCELLANEOUS) IMPLANT
SHEATH GLIDE SLENDER 4/5FR (SHEATH) IMPLANT
STATION PROTECTION PRESSURIZED (MISCELLANEOUS) IMPLANT

## 2024-05-02 NOTE — Discharge Instructions (Signed)
 Radial Site Care Refer to this sheet in the next few weeks. These instructions provide you with information about caring for yourself after your procedure. Your health care provider may also give you more specific instructions. Your treatment has been planned according to current medical practices, but problems sometimes occur. Call your health care provider if you have any problems or questions after your procedure. What can I expect after the procedure? After your procedure, it is typical to have the following: Bruising at the radial site that usually fades within 1-2 weeks. Blood collecting in the tissue (hematoma) that may be painful to the touch. It should usually decrease in size and tenderness within 1-2 weeks.  Follow these instructions at home: Take medicines only as directed by your health care provider. If you are on a medication called Metformin please do not take for 48 hours after your procedure. Over the next 48hrs please increase your fluid intake of water and non caffeine beverages to flush the contrast dye out of your system.  You may shower 24 hours after the procedure  Leave your bandage on and gently wash the site with plain soap and water. Pat the area dry with a clean towel. Do not rub the site, because this may cause bleeding.  Remove your dressing 48hrs after your procedure and leave open to air.  Do not submerge your site in water for 7 days. This includes swimming and washing dishes.  Check your insertion site every day for redness, swelling, or drainage. Do not apply powder or lotion to the site. Do not flex or bend the affected arm for 24 hours or as directed by your health care provider. Do not push or pull heavy objects with the affected arm for 24 hours or as directed by your health care provider. Do not lift over 10 lb (4.5 kg) for 5 days after your procedure or as directed by your health care provider. Ask your health care provider when it is okay to: Return to  work or school. Resume usual physical activities or sports. Resume sexual activity. Do not drive home if you are discharged the same day as the procedure. Have someone else drive you. You may drive 48 hours after the procedure Do not operate machinery or power tools for 24 hours after the procedure. If your procedure was done as an outpatient procedure, which means that you went home the same day as your procedure, a responsible adult should be with you for the first 24 hours after you arrive home. Keep all follow-up visits as directed by your health care provider. This is important. Contact a health care provider if: You have a fever. You have chills. You have increased bleeding from the radial site. Hold pressure on the site. Get help right away if: You have unusual pain at the radial site. You have redness, warmth, or swelling at the radial site. You have drainage (other than a small amount of blood on the dressing) from the radial site. The radial site is bleeding, and the bleeding does not stop after 15 minutes of holding steady pressure on the site. Your arm or hand becomes pale, cool, tingly, or numb. This information is not intended to replace advice given to you by your health care provider. Make sure you discuss any questions you have with your health care provider. Document Released: 06/17/2010 Document Revised: 10/21/2015 Document Reviewed: 12/01/2013 Elsevier Interactive Patient Education  2018 [employer].

## 2024-05-06 ENCOUNTER — Encounter: Payer: Self-pay | Admitting: Internal Medicine

## 2024-05-06 LAB — CARDIAC CATHETERIZATION: Cath EF Quantitative: 60 %

## 2024-05-21 ENCOUNTER — Other Ambulatory Visit: Payer: Self-pay

## 2024-05-21 DIAGNOSIS — I350 Nonrheumatic aortic (valve) stenosis: Secondary | ICD-10-CM

## 2024-05-27 ENCOUNTER — Encounter (HOSPITAL_COMMUNITY): Payer: Self-pay

## 2024-06-02 ENCOUNTER — Ambulatory Visit
Admission: RE | Admit: 2024-06-02 | Discharge: 2024-06-02 | Disposition: A | Discharge status: Home / Self Care | Source: Ambulatory Visit

## 2024-06-02 DIAGNOSIS — I350 Nonrheumatic aortic (valve) stenosis: Secondary | ICD-10-CM | POA: Diagnosis not present

## 2024-06-02 MED ORDER — IOHEXOL 350 MG/ML SOLN
100.0000 mL | Freq: Once | INTRAVENOUS | Status: AC | PRN
  Administered 2024-06-02: 100 mL via INTRAVENOUS
